# Patient Record
Sex: Female | Born: 1995 | Race: White | Hispanic: No | Marital: Single | Smoking: Current every day smoker
Health system: Southern US, Community
[De-identification: ages and names within clinical notes are randomized; demographics above are authoritative.]

## PROBLEM LIST (undated history)

## (undated) ENCOUNTER — Inpatient Hospital Stay (HOSPITAL_COMMUNITY): Payer: Self-pay

## (undated) DIAGNOSIS — Z789 Other specified health status: Secondary | ICD-10-CM

## (undated) DIAGNOSIS — Z8759 Personal history of other complications of pregnancy, childbirth and the puerperium: Secondary | ICD-10-CM

## (undated) HISTORY — PX: TONSILLECTOMY: SUR1361

## (undated) HISTORY — PX: TUBAL LIGATION: SHX77

## (undated) HISTORY — PX: OTHER SURGICAL HISTORY: SHX169

---
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
## 2016-08-25 ENCOUNTER — Emergency Department (HOSPITAL_COMMUNITY)
Admission: EM | Admit: 2016-08-25 | Discharge: 2016-08-25 | Disposition: A | Discharge status: Home / Self Care | Payer: Medicaid Other | Attending: Emergency Medicine | Admitting: Emergency Medicine

## 2016-08-25 ENCOUNTER — Encounter (HOSPITAL_COMMUNITY): Payer: Self-pay | Admitting: Emergency Medicine

## 2016-08-25 DIAGNOSIS — L03113 Cellulitis of right upper limb: Secondary | ICD-10-CM

## 2016-08-25 DIAGNOSIS — Y999 Unspecified external cause status: Secondary | ICD-10-CM | POA: Insufficient documentation

## 2016-08-25 DIAGNOSIS — Y939 Activity, unspecified: Secondary | ICD-10-CM | POA: Insufficient documentation

## 2016-08-25 DIAGNOSIS — Y929 Unspecified place or not applicable: Secondary | ICD-10-CM | POA: Insufficient documentation

## 2016-08-25 DIAGNOSIS — W57XXXA Bitten or stung by nonvenomous insect and other nonvenomous arthropods, initial encounter: Secondary | ICD-10-CM | POA: Insufficient documentation

## 2016-08-25 LAB — POC URINE PREG, ED: PREG TEST UR: NEGATIVE

## 2016-08-25 MED ORDER — IBUPROFEN 800 MG PO TABS: 800.0000 mg | ORAL_TABLET | Freq: Three times a day (TID) | ORAL | 0 refills | Status: DC

## 2016-08-25 MED ORDER — DOXYCYCLINE HYCLATE 100 MG PO CAPS: 100.0000 mg | ORAL_CAPSULE | Freq: Two times a day (BID) | ORAL | 0 refills | Status: DC

## 2016-08-25 NOTE — Discharge Instructions (Signed)
Return here for any worsened or spreading redness or your symptoms are not starting to improve over the next 48 hours. Apply a warm water soak to your arm for 20 minutes several times daily.

## 2016-08-25 NOTE — ED Triage Notes (Signed)
Pt thinks she was bitten by a brown recluse spider. Pt had large lemon size redness and warmth noted to posterior upper arm.

## 2016-08-25 NOTE — ED Notes (Signed)
Pt sitting in waiting area; NAD noted.
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
## 2017-11-20 ENCOUNTER — Encounter (HOSPITAL_COMMUNITY): Payer: Self-pay | Admitting: Emergency Medicine

## 2017-11-20 ENCOUNTER — Emergency Department (HOSPITAL_COMMUNITY): Payer: No Typology Code available for payment source

## 2017-11-20 ENCOUNTER — Other Ambulatory Visit: Payer: Self-pay

## 2017-11-20 ENCOUNTER — Emergency Department (HOSPITAL_COMMUNITY)
Admission: EM | Admit: 2017-11-20 | Discharge: 2017-11-20 | Disposition: A | Discharge status: Home / Self Care | Payer: No Typology Code available for payment source | Attending: Emergency Medicine | Admitting: Emergency Medicine

## 2017-11-20 DIAGNOSIS — Z79899 Other long term (current) drug therapy: Secondary | ICD-10-CM | POA: Diagnosis not present

## 2017-11-20 DIAGNOSIS — Y939 Activity, unspecified: Secondary | ICD-10-CM | POA: Diagnosis not present

## 2017-11-20 DIAGNOSIS — F17210 Nicotine dependence, cigarettes, uncomplicated: Secondary | ICD-10-CM | POA: Diagnosis not present

## 2017-11-20 DIAGNOSIS — S161XXA Strain of muscle, fascia and tendon at neck level, initial encounter: Secondary | ICD-10-CM

## 2017-11-20 DIAGNOSIS — T148XXA Other injury of unspecified body region, initial encounter: Secondary | ICD-10-CM

## 2017-11-20 DIAGNOSIS — Y999 Unspecified external cause status: Secondary | ICD-10-CM | POA: Insufficient documentation

## 2017-11-20 DIAGNOSIS — Y92410 Unspecified street and highway as the place of occurrence of the external cause: Secondary | ICD-10-CM | POA: Insufficient documentation

## 2017-11-20 DIAGNOSIS — S199XXA Unspecified injury of neck, initial encounter: Secondary | ICD-10-CM | POA: Diagnosis present

## 2017-11-20 MED ORDER — ONDANSETRON HCL 4 MG PO TABS
4.0000 mg | ORAL_TABLET | Freq: Once | ORAL | Status: AC
  Administered 2017-11-20: 4 mg via ORAL
  Filled 2017-11-20: qty 1

## 2017-11-20 MED ORDER — IBUPROFEN 600 MG PO TABS: 600.0000 mg | ORAL_TABLET | Freq: Four times a day (QID) | ORAL | 0 refills | Status: DC

## 2017-11-20 MED ORDER — KETOROLAC TROMETHAMINE 10 MG PO TABS
10.0000 mg | ORAL_TABLET | Freq: Once | ORAL | Status: AC
  Administered 2017-11-20: 10 mg via ORAL
  Filled 2017-11-20: qty 1

## 2017-11-20 MED ORDER — CYCLOBENZAPRINE HCL 10 MG PO TABS: 10.0000 mg | ORAL_TABLET | Freq: Three times a day (TID) | ORAL | 0 refills | Status: DC
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
## 2017-11-20 NOTE — ED Notes (Signed)
Patient to CT.

## 2017-11-20 NOTE — Discharge Instructions (Addendum)
The CT scan of your neck confirms that there is no fracture or dislocation.  You do have a great deal of spasm in the muscles involving her neck and also the ones involving your mid back.  You can expect to be sore over the next few days.  Please use Flexeril 3 times daily for spasm pain.  This medication may cause drowsiness.  Please do not drive a vehicle, operate machinery, handle illegal documents, drink alcohol, or participate in activities requiring concentration when taking this medication.  Please use ibuprofen with breakfast, lunch, dinner, and at bedtime.

## 2018-08-25 ENCOUNTER — Other Ambulatory Visit: Payer: Self-pay

## 2018-08-25 ENCOUNTER — Emergency Department (HOSPITAL_COMMUNITY)
Admission: EM | Admit: 2018-08-25 | Discharge: 2018-08-25 | Disposition: A | Discharge status: Home / Self Care | Payer: Self-pay | Attending: Emergency Medicine | Admitting: Emergency Medicine

## 2018-08-25 ENCOUNTER — Encounter (HOSPITAL_COMMUNITY): Payer: Self-pay

## 2018-08-25 ENCOUNTER — Emergency Department (HOSPITAL_COMMUNITY): Payer: Self-pay

## 2018-08-25 DIAGNOSIS — R0789 Other chest pain: Secondary | ICD-10-CM | POA: Insufficient documentation

## 2018-08-25 DIAGNOSIS — R079 Chest pain, unspecified: Secondary | ICD-10-CM

## 2018-08-25 DIAGNOSIS — Z79899 Other long term (current) drug therapy: Secondary | ICD-10-CM | POA: Insufficient documentation

## 2018-08-25 DIAGNOSIS — F17210 Nicotine dependence, cigarettes, uncomplicated: Secondary | ICD-10-CM | POA: Insufficient documentation

## 2018-08-25 LAB — BASIC METABOLIC PANEL
Anion gap: 7 (ref 5–15)
BUN: 9 mg/dL (ref 6–20)
CO2: 26 mmol/L (ref 22–32)
Calcium: 9.4 mg/dL (ref 8.9–10.3)
Chloride: 104 mmol/L (ref 98–111)
Creatinine, Ser: 0.94 mg/dL (ref 0.44–1.00)
GFR calc Af Amer: >60 mL/min (ref >60)
GFR calc non Af Amer: >60 mL/min (ref >60)
Glucose, Bld: 85 mg/dL (ref 70–99)
Potassium: 4 mmol/L (ref 3.5–5.1)
Sodium: 137 mmol/L (ref 135–145)

## 2018-08-25 LAB — CBC
HCT: 46.1 % — ABNORMAL HIGH (ref 36.0–46.0)
Hemoglobin: 16.1 g/dL — ABNORMAL HIGH (ref 12.0–15.0)
MCH: 32.5 pg (ref 26.0–34.0)
MCHC: 34.9 g/dL (ref 30.0–36.0)
MCV: 93.1 fL (ref 80.0–100.0)
Platelets: 253 10*3/uL (ref 150–400)
RBC: 4.95 MIL/uL (ref 3.87–5.11)
RDW: 12.4 % (ref 11.5–15.5)
WBC: 6.3 10*3/uL (ref 4.0–10.5)
nRBC: 0 % (ref 0.0–0.2)

## 2018-08-25 LAB — TROPONIN I: Troponin I: <0.03 ng/mL (ref <0.03)

## 2018-08-25 MED ORDER — SODIUM CHLORIDE 0.9% FLUSH: 3.0000 mL | Freq: Once | INTRAVENOUS | Status: DC

## 2018-08-25 MED ORDER — NAPROXEN 500 MG PO TABS: 500.0000 mg | ORAL_TABLET | Freq: Two times a day (BID) | ORAL | 0 refills | Status: DC
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
## 2018-08-25 NOTE — ED Notes (Signed)
Pt was informed that we need a urine sample. 

## 2018-08-25 NOTE — ED Triage Notes (Signed)
Pt reports central CP for 3 days . States pain is constant and is a burning/pressure feeling

## 2018-08-25 NOTE — Discharge Instructions (Signed)
Work-up for the chest pain without any acute or dangerous findings.  No evidence of any problems with the heart chest x-ray was normal.  Suspicious that it may be inflammation of your cartilage there in the central part of your chest.  Would recommend a trial of Naprosyn for the next 7 days.  Return for any new or worse symptoms.

## 2018-10-11 ENCOUNTER — Other Ambulatory Visit: Payer: Self-pay

## 2018-10-11 ENCOUNTER — Emergency Department (HOSPITAL_COMMUNITY)
Admission: EM | Admit: 2018-10-11 | Discharge: 2018-10-11 | Disposition: A | Discharge status: Home / Self Care | Payer: Self-pay | Attending: Emergency Medicine | Admitting: Emergency Medicine

## 2018-10-11 ENCOUNTER — Encounter (HOSPITAL_COMMUNITY): Payer: Self-pay | Admitting: Emergency Medicine

## 2018-10-11 DIAGNOSIS — R200 Anesthesia of skin: Secondary | ICD-10-CM | POA: Insufficient documentation

## 2018-10-11 DIAGNOSIS — F17210 Nicotine dependence, cigarettes, uncomplicated: Secondary | ICD-10-CM | POA: Insufficient documentation

## 2018-10-11 DIAGNOSIS — Z79899 Other long term (current) drug therapy: Secondary | ICD-10-CM | POA: Insufficient documentation

## 2018-10-11 DIAGNOSIS — R202 Paresthesia of skin: Secondary | ICD-10-CM

## 2018-10-11 NOTE — Discharge Instructions (Addendum)
Follow up with your md if any problems °
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
## 2018-10-11 NOTE — ED Triage Notes (Signed)
Pt c/o right sided face/chest numbness that started at 2030.
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
## 2019-11-03 ENCOUNTER — Other Ambulatory Visit: Payer: Self-pay

## 2019-11-03 ENCOUNTER — Emergency Department (HOSPITAL_COMMUNITY)
Admission: EM | Admit: 2019-11-03 | Discharge: 2019-11-03 | Disposition: A | Discharge status: Home / Self Care | Payer: Self-pay | Attending: Emergency Medicine | Admitting: Emergency Medicine

## 2019-11-03 ENCOUNTER — Encounter (HOSPITAL_COMMUNITY): Payer: Self-pay | Admitting: *Deleted

## 2019-11-03 DIAGNOSIS — F17210 Nicotine dependence, cigarettes, uncomplicated: Secondary | ICD-10-CM | POA: Insufficient documentation

## 2019-11-03 DIAGNOSIS — L559 Sunburn, unspecified: Secondary | ICD-10-CM | POA: Insufficient documentation

## 2019-11-03 MED ORDER — CALAMINE EX LOTN
1.0000 | TOPICAL_LOTION | CUTANEOUS | 1 refills | Status: DC | PRN
Start: 2019-11-03 — End: 2019-12-05
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
## 2019-11-03 NOTE — ED Triage Notes (Signed)
Pt c/o burning to her skin from sunburn that happened yesterday. Pt reports she was out in the sun yesterday afternoon for about 3 hours and was wearing sunscreen. Pt has used aloe with no relief.

## 2019-12-05 ENCOUNTER — Encounter: Payer: Self-pay | Admitting: Adult Health

## 2019-12-05 ENCOUNTER — Ambulatory Visit (INDEPENDENT_AMBULATORY_CARE_PROVIDER_SITE_OTHER): Payer: Medicaid Other | Admitting: Adult Health

## 2019-12-05 ENCOUNTER — Encounter: Payer: Self-pay | Admitting: *Deleted

## 2019-12-05 VITALS — BP 106/65 | HR 72 | Ht 62.0 in | Wt 201.0 lb

## 2019-12-05 DIAGNOSIS — O3680X0 Pregnancy with inconclusive fetal viability, not applicable or unspecified: Secondary | ICD-10-CM | POA: Insufficient documentation

## 2019-12-05 DIAGNOSIS — Z3201 Encounter for pregnancy test, result positive: Secondary | ICD-10-CM

## 2019-12-05 DIAGNOSIS — Z98891 History of uterine scar from previous surgery: Secondary | ICD-10-CM

## 2019-12-05 DIAGNOSIS — Z3A08 8 weeks gestation of pregnancy: Secondary | ICD-10-CM

## 2019-12-05 LAB — POCT URINE PREGNANCY: Preg Test, Ur: POSITIVE — AB

## 2019-12-05 NOTE — Patient Instructions (Signed)
First Trimester of Pregnancy The first trimester of pregnancy is from week 1 until the end of week 13 (months 1 through 3). A week after a sperm fertilizes an egg, the egg will implant on the wall of the uterus. This embryo will begin to develop into a baby. Genes from you and your partner will form the baby. The female genes will determine whether the baby will be a boy or a girl. At 6-8 weeks, the eyes and face will be formed, and the heartbeat can be seen on ultrasound. At the end of 12 weeks, all the baby's organs will be formed. Now that you are pregnant, you will want to do everything you can to have a healthy baby. Two of the most important things are to get good prenatal care and to follow your health care provider's instructions. Prenatal care is all the medical care you receive before the baby's birth. This care will help prevent, find, and treat any problems during the pregnancy and childbirth. Body changes during your first trimester Your body goes through many changes during pregnancy. The changes vary from woman to woman.  You may gain or lose a couple of pounds at first.  You may feel sick to your stomach (nauseous) and you may throw up (vomit). If the vomiting is uncontrollable, call your health care provider.  You may tire easily.  You may develop headaches that can be relieved by medicines. All medicines should be approved by your health care provider.  You may urinate more often. Painful urination may mean you have a bladder infection.  You may develop heartburn as a result of your pregnancy.  You may develop constipation because certain hormones are causing the muscles that push stool through your intestines to slow down.  You may develop hemorrhoids or swollen veins (varicose veins).  Your breasts may begin to grow larger and become tender. Your nipples may stick out more, and the tissue that surrounds them (areola) may become darker.  Your gums may bleed and may be  sensitive to brushing and flossing.  Dark spots or blotches (chloasma, mask of pregnancy) may develop on your face. This will likely fade after the baby is born.  Your menstrual periods will stop.  You may have a loss of appetite.  You may develop cravings for certain kinds of food.  You may have changes in your emotions from day to day, such as being excited to be pregnant or being concerned that something may go wrong with the pregnancy and baby.  You may have more vivid and strange dreams.  You may have changes in your hair. These can include thickening of your hair, rapid growth, and changes in texture. Some women also have hair loss during or after pregnancy, or hair that feels dry or thin. Your hair will most likely return to normal after your baby is born. What to expect at prenatal visits During a routine prenatal visit:  You will be weighed to make sure you and the baby are growing normally.  Your blood pressure will be taken.  Your abdomen will be measured to track your baby's growth.  The fetal heartbeat will be listened to between weeks 10 and 14 of your pregnancy.  Test results from any previous visits will be discussed. Your health care provider may ask you:  How you are feeling.  If you are feeling the baby move.  If you have had any abnormal symptoms, such as leaking fluid, bleeding, severe headaches, or abdominal   cramping.  If you are using any tobacco products, including cigarettes, chewing tobacco, and electronic cigarettes.  If you have any questions. Other tests that may be performed during your first trimester include:  Blood tests to find your blood type and to check for the presence of any previous infections. The tests will also be used to check for low iron levels (anemia) and protein on red blood cells (Rh antibodies). Depending on your risk factors, or if you previously had diabetes during pregnancy, you may have tests to check for high blood sugar  that affects pregnant women (gestational diabetes).  Urine tests to check for infections, diabetes, or protein in the urine.  An ultrasound to confirm the proper growth and development of the baby.  Fetal screens for spinal cord problems (spina bifida) and Down syndrome.  HIV (human immunodeficiency virus) testing. Routine prenatal testing includes screening for HIV, unless you choose not to have this test.  You may need other tests to make sure you and the baby are doing well. Follow these instructions at home: Medicines  Follow your health care provider's instructions regarding medicine use. Specific medicines may be either safe or unsafe to take during pregnancy.  Take a prenatal vitamin that contains at least 600 micrograms (mcg) of folic acid.  If you develop constipation, try taking a stool softener if your health care provider approves. Eating and drinking   Eat a balanced diet that includes fresh fruits and vegetables, whole grains, good sources of protein such as meat, eggs, or tofu, and low-fat dairy. Your health care provider will help you determine the amount of weight gain that is right for you.  Avoid raw meat and uncooked cheese. These carry germs that can cause birth defects in the baby.  Eating four or five small meals rather than three large meals a day may help relieve nausea and vomiting. If you start to feel nauseous, eating a few soda crackers can be helpful. Drinking liquids between meals, instead of during meals, also seems to help ease nausea and vomiting.  Limit foods that are high in fat and processed sugars, such as fried and sweet foods.  To prevent constipation: ? Eat foods that are high in fiber, such as fresh fruits and vegetables, whole grains, and beans. ? Drink enough fluid to keep your urine clear or pale yellow. Activity  Exercise only as directed by your health care provider. Most women can continue their usual exercise routine during  pregnancy. Try to exercise for 30 minutes at least 5 days a week. Exercising will help you: ? Control your weight. ? Stay in shape. ? Be prepared for labor and delivery.  Experiencing pain or cramping in the lower abdomen or lower back is a good sign that you should stop exercising. Check with your health care provider before continuing with normal exercises.  Try to avoid standing for long periods of time. Move your legs often if you must stand in one place for a long time.  Avoid heavy lifting.  Wear low-heeled shoes and practice good posture.  You may continue to have sex unless your health care provider tells you not to. Relieving pain and discomfort  Wear a good support bra to relieve breast tenderness.  Take warm sitz baths to soothe any pain or discomfort caused by hemorrhoids. Use hemorrhoid cream if your health care provider approves.  Rest with your legs elevated if you have leg cramps or low back pain.  If you develop varicose veins in   your legs, wear support hose. Elevate your feet for 15 minutes, 3-4 times a day. Limit salt in your diet. Prenatal care  Schedule your prenatal visits by the twelfth week of pregnancy. They are usually scheduled monthly at first, then more often in the last 2 months before delivery.  Write down your questions. Take them to your prenatal visits.  Keep all your prenatal visits as told by your health care provider. This is important. Safety  Wear your seat belt at all times when driving.  Make a list of emergency phone numbers, including numbers for family, friends, the hospital, and police and fire departments. General instructions  Ask your health care provider for a referral to a local prenatal education class. Begin classes no later than the beginning of month 6 of your pregnancy.  Ask for help if you have counseling or nutritional needs during pregnancy. Your health care provider can offer advice or refer you to specialists for help  with various needs.  Do not use hot tubs, steam rooms, or saunas.  Do not douche or use tampons or scented sanitary pads.  Do not cross your legs for long periods of time.  Avoid cat litter boxes and soil used by cats. These carry germs that can cause birth defects in the baby and possibly loss of the fetus by miscarriage or stillbirth.  Avoid all smoking, herbs, alcohol, and medicines not prescribed by your health care provider. Chemicals in these products affect the formation and growth of the baby.  Do not use any products that contain nicotine or tobacco, such as cigarettes and e-cigarettes. If you need help quitting, ask your health care provider. You may receive counseling support and other resources to help you quit.  Schedule a dentist appointment. At home, brush your teeth with a soft toothbrush and be gentle when you floss. Contact a health care provider if:  You have dizziness.  You have mild pelvic cramps, pelvic pressure, or nagging pain in the abdominal area.  You have persistent nausea, vomiting, or diarrhea.  You have a bad smelling vaginal discharge.  You have pain when you urinate.  You notice increased swelling in your face, hands, legs, or ankles.  You are exposed to fifth disease or chickenpox.  You are exposed to German measles (rubella) and have never had it. Get help right away if:  You have a fever.  You are leaking fluid from your vagina.  You have spotting or bleeding from your vagina.  You have severe abdominal cramping or pain.  You have rapid weight gain or loss.  You vomit blood or material that looks like coffee grounds.  You develop a severe headache.  You have shortness of breath.  You have any kind of trauma, such as from a fall or a car accident. Summary  The first trimester of pregnancy is from week 1 until the end of week 13 (months 1 through 3).  Your body goes through many changes during pregnancy. The changes vary from  woman to woman.  You will have routine prenatal visits. During those visits, your health care provider will examine you, discuss any test results you may have, and talk with you about how you are feeling. This information is not intended to replace advice given to you by your health care provider. Make sure you discuss any questions you have with your health care provider. Document Revised: 04/15/2017 Document Reviewed: 04/14/2016 Elsevier Patient Education  2020 [employer]  

## 2019-12-05 NOTE — Progress Notes (Signed)
°  Subjective:     Patient ID: Joyce Ball, female   DOB: 10-01-1995, 24 y.o.   MRN: [id number]  HPI Lucelia is a 24 year old white female,single, in for UPT has missed periods and had 5+HPTs. She works at [employer]. She had C section in May 2017, she had borderline gestational Diabetes, not on any meds.  Review of Systems +missed periods,with 5+HPTs Reviewed past medical,surgical, social and family history. Reviewed medications and allergies.     Objective:   Physical Exam BP 106/65 (BP Location: Left Arm, Patient Position: Sitting, Cuff Size: Normal)    Pulse 72    Ht 5\' 2"  (1.575 m)    Wt 201 lb (91.2 kg)    LMP 10/05/2019    BMI 36.76 kg/m UPT is +, about 8+5 weeks by LMP with EDD 07/11/20. Skin warm and dry. Neck: mid line trachea, normal thyroid, good ROM, no lymphadenopathy noted. Lungs: clear to ausculation bilaterally. Cardiovascular: regular rate and rhythm.Abdomen is soft and non tender AA is 1 Fall risk is low PHQ 9 score is 1     Assessment:     1. Pregnancy test positive Continue Prenatal Gummies  2. [redacted] weeks gestation of pregnancy   3. Encounter to determine fetal viability of pregnancy, single or unspecified fetus Return in about 1 week for dating 07/13/20   4. History of cesarean section Notes in [facility]    Plan:     Review handout by [facility]

## 2019-12-12 ENCOUNTER — Ambulatory Visit (INDEPENDENT_AMBULATORY_CARE_PROVIDER_SITE_OTHER): Payer: Medicaid Other

## 2019-12-12 DIAGNOSIS — O3680X0 Pregnancy with inconclusive fetal viability, not applicable or unspecified: Secondary | ICD-10-CM | POA: Diagnosis not present

## 2019-12-12 NOTE — Progress Notes (Signed)
[place] 9+5 wks,single IUP with ys,fhr 169 bpm,crl 25.75 mm,normal ovaries

## 2019-12-14 ENCOUNTER — Encounter: Payer: Self-pay | Admitting: *Deleted

## 2019-12-14 ENCOUNTER — Telehealth: Payer: Self-pay | Admitting: Advanced Practice Midwife

## 2019-12-14 NOTE — Telephone Encounter (Signed)
Letter completed for patient per Irving Copas, Ok to have more frequent breaks. Attempted to call patient, no answer. Letter at front desk.

## 2019-12-14 NOTE — Telephone Encounter (Signed)
Patient wants to know if she can get a doctors note to go to the bathroom when needed. She stated her job is making a big deal about her going to the bathroom so  Much.  She also stated if possible to call her after one.

## 2020-01-02 ENCOUNTER — Encounter: Payer: Self-pay | Admitting: Advanced Practice Midwife

## 2020-01-02 ENCOUNTER — Ambulatory Visit (INDEPENDENT_AMBULATORY_CARE_PROVIDER_SITE_OTHER): Payer: Medicaid Other | Admitting: Advanced Practice Midwife

## 2020-01-02 ENCOUNTER — Ambulatory Visit: Payer: Medicaid Other | Admitting: *Deleted

## 2020-01-02 ENCOUNTER — Other Ambulatory Visit: Payer: Medicaid Other

## 2020-01-02 ENCOUNTER — Other Ambulatory Visit: Payer: Self-pay

## 2020-01-02 VITALS — BP 101/65 | HR 74 | Wt 198.5 lb

## 2020-01-02 DIAGNOSIS — Z3480 Encounter for supervision of other normal pregnancy, unspecified trimester: Secondary | ICD-10-CM

## 2020-01-02 DIAGNOSIS — Z98891 History of uterine scar from previous surgery: Secondary | ICD-10-CM

## 2020-01-02 DIAGNOSIS — Z331 Pregnant state, incidental: Secondary | ICD-10-CM

## 2020-01-02 DIAGNOSIS — Z3490 Encounter for supervision of normal pregnancy, unspecified, unspecified trimester: Secondary | ICD-10-CM | POA: Insufficient documentation

## 2020-01-02 DIAGNOSIS — O0990 Supervision of high risk pregnancy, unspecified, unspecified trimester: Secondary | ICD-10-CM | POA: Insufficient documentation

## 2020-01-02 DIAGNOSIS — Z3A12 12 weeks gestation of pregnancy: Secondary | ICD-10-CM | POA: Diagnosis not present

## 2020-01-02 DIAGNOSIS — Z1389 Encounter for screening for other disorder: Secondary | ICD-10-CM

## 2020-01-02 LAB — POCT URINALYSIS DIPSTICK OB
Blood, UA: NEGATIVE
Glucose, UA: NEGATIVE
Ketones, UA: NEGATIVE
Leukocytes, UA: NEGATIVE
Nitrite, UA: POSITIVE
POC,PROTEIN,UA: NEGATIVE

## 2020-01-02 NOTE — Progress Notes (Signed)
   [facility]  SUBJECTIVE:  Joyce Ball is a 24 y.o. G79P1001 female here for Panorama NIPT and Horizon Carrier Screening . She is [redacted]w[redacted]d pregnant.   OBJECTIVE:  Appears well, in no apparent distress  Blood work unable to be drawn from left [facility]. 1 attempt(s). Pt very dry and dehydrated.  PLAN: Pt to hydrate and call to reschedule for labs to be drawn.  Joyce Ball  01/02/2020 10:16 AM

## 2020-01-02 NOTE — Progress Notes (Signed)
INITIAL OBSTETRICAL VISIT Patient name: Joyce Ball MRN [id number]  Date of birth: [birth year] Chief Complaint:   Initial Prenatal Visit  History of Present Illness:   Joyce Ball is a 24 y.o. G39P1001 Caucasian female at [redacted]w[redacted]d by LMP c/w u/s at 9 weeks with an Estimated Date of Delivery: 07/11/20 being seen today for her initial obstetrical visit.   Her obstetrical history is significant for hx pLTCS for NRFHR and failed vacuum.   Today she reports no complaints.  Depression screen [facility] 2/9 01/02/2020 12/05/2019  Decreased Interest 0 0  Down, Depressed, Hopeless 0 0  PHQ - 2 Score 0 0  Altered sleeping 0 0  Tired, decreased energy 0 1  Change in appetite 0 0  Feeling bad or failure about yourself  0 0  Trouble concentrating 0 0  Moving slowly or fidgety/restless 0 0  Suicidal thoughts 0 0  PHQ-9 Score 0 1    Patient's last menstrual period was 10/05/2019. Last pap 2019 at [facility]. Results were: normal Review of Systems:   Pertinent items are noted in HPI Denies cramping/contractions, leakage of fluid, vaginal bleeding, abnormal vaginal discharge w/ itching/odor/irritation, headaches, visual changes, shortness of breath, chest pain, abdominal pain, severe nausea/vomiting, or problems with urination or bowel movements unless otherwise stated above.  Pertinent History Reviewed:  Reviewed past medical,surgical, social, obstetrical and family history.  Reviewed problem list, medications and allergies. OB History  Gravida Para Term Preterm AB Living  2 1 1     1   SAB TAB Ectopic Multiple Live Births        0 1    # Outcome Date GA Lbr Len/2nd Weight Sex Delivery Anes PTL Lv  2 Current           1 Term 10/04/15 [redacted]w[redacted]d 07:01 / 00:41 7 lb 8.6 oz (3.42 kg) F CS-LTranv EPI N LIV     Complications: Fetal Intolerance, Failed vacuum extraction delivery   Physical Assessment:   Vitals:   01/02/20 0953  BP: 101/65  Pulse: 74  Weight: 198 lb 8 oz (90 kg)  Body mass index is 36.31  kg/m.       Physical Examination:  General appearance - well appearing, and in no distress  Mental status - alert, oriented to person, place, and time  Psych:  She has a normal mood and affect  Skin - warm and dry, normal color, no suspicious lesions noted  Chest - effort normal, all lung fields clear to auscultation bilaterally  Heart - normal rate and regular rhythm  Abdomen - soft, nontender  Extremities:  No swelling or varicosities noted  Pelvic - not indicated  Thin prep pap is not done   TODAY'S FHT 153 via doppler (u/s staff not available for NT today)  Results for orders placed or performed in visit on 01/02/20 (from the past 24 hour(s))  POC Urinalysis Dipstick OB   Collection Time: 01/02/20  9:39 AM  Result Value Ref Range   Color, UA     Clarity, UA     Glucose, UA Negative Negative   Bilirubin, UA     Ketones, UA neg    Spec Grav, UA     Blood, UA neg    pH, UA     POC,PROTEIN,UA Negative Negative, Trace, Small (1+), Moderate (2+), Large (3+), 4+   Urobilinogen, UA     Nitrite, UA positive    Leukocytes, UA Negative Negative   Appearance     Odor  Assessment & Plan:  1) Low-Risk Pregnancy G2P1001 at [redacted]w[redacted]d with an Estimated Date of Delivery: 07/11/20   2) Initial OB visit   3) Previous C/S for NRFHR/failed vac> VBAC consent reviewed with R&Bs for both; pt leaning towards repeat C/S; will continue to review  4) Hx PPH> didn't require blood tx  5) + Nitrites, no symptoms> to culture  Meds: No orders of the defined types were placed in this encounter.   Initial labs obtained Continue prenatal vitamins Reviewed n/v relief measures and warning s/s to report Reviewed recommended weight gain based on pre-gravid BMI Encouraged well-balanced diet Genetic & carrier screening discussed: requests Panorama and NT/IT, requests Horizon 14 , but unable to be drawn today as she is dehydrated. Will redo with NT u/s later this week. Ultrasound discussed; fetal  survey: requested CCNC completed> form faxed if has or is planning to apply for medicaid The nature of [facility] [employer] with multiple MDs and other Advanced Practice Providers was explained to patient; also emphasized that fellows, residents, and students are part of our team. Has home bp cuff. Check bp weekly, let us know if >140/90.   No indications for ASA therapy or early Hgb A1c (per uptodate)   Follow-up: Return in about 5 weeks (around 02/08/2020) for (return ASAP for NT u/s); , LR[facility], in person, [place]: Anatomy, 2nd IT.   Orders Placed This Encounter  Procedures  . GC/Chlamydia Probe Amp  . Urine Culture  . US OB Comp + 14 Wk  . Pain Management Screening Profile (10S)  . CBC/D/Plt+RPR+Rh+ABO+Rub Ab...  . Genetic Screening  . POC Urinalysis Dipstick OB    Arabella Merles [facility] 01/02/2020 2:17 PM

## 2020-01-02 NOTE — Patient Instructions (Signed)
Joyce Ball, I greatly value your feedback.  If you receive a survey following your visit with [place] today, we appreciate you taking the time to fill it out.  Thanks, Philipp Deputy CNM   [facility] ([address] [place], [place] [address]) Entrance C, located off of [address] Free 24/7 valet parking   Nausea & Vomiting  Have saltine crackers or pretzels by your bed and eat a few bites before you raise your head out of bed in the morning  Eat small frequent meals throughout the day instead of large meals  Drink plenty of fluids throughout the day to stay hydrated, just don't drink a lot of fluids with your meals.  This can make your stomach fill up faster making you feel sick  Do not brush your teeth right after you eat  Products with real ginger are good for nausea, like ginger ale and ginger hard candy Make sure it says made with real ginger!  Sucking on sour candy like lemon heads is also good for nausea  If your prenatal vitamins make you nauseated, take them at night so you will sleep through the nausea  Sea Bands  If you feel like you need medicine for the nausea & vomiting please let us know  If you are unable to keep any fluids or food down please let us know   Constipation  Drink plenty of fluid, preferably water, throughout the day  Eat foods high in fiber such as fruits, vegetables, and grains  Exercise, such as walking, is a good way to keep your bowels regular  Drink warm fluids, especially warm prune juice, or decaf coffee  Eat a 1/2 cup of real oatmeal (not instant), 1/2 cup applesauce, and 1/2-1 cup warm prune juice every day  If needed, you may take Colace (docusate sodium) stool softener once or twice a day to help keep the stool soft.   If you still are having problems with constipation, you may take Miralax once daily as needed to help keep your bowels regular.   Home Blood Pressure Monitoring for Patients   Your  provider has recommended that you check your blood pressure (BP) at least once a week at home. If you do not have a blood pressure cuff at home, one will be provided for you. Contact your provider if you have not received your monitor within 1 week.   Helpful Tips for Accurate Home Blood Pressure Checks  . Don't smoke, exercise, or drink caffeine 30 minutes before checking your BP . Use the restroom before checking your BP (a full bladder can raise your pressure) . Relax in a comfortable upright chair . Feet on the ground . Left arm resting comfortably on a flat surface at the level of your heart . Legs uncrossed . Back supported . Sit quietly and don't talk . Place the cuff on your bare arm . Adjust snuggly, so that only two fingertips can fit between your skin and the top of the cuff . Check 2 readings separated by at least one minute . Keep a log of your BP readings . For a visual, please reference this diagram: http://[facility].care/bpdiagram  Provider Name: [facility]     Phone: [phone]  Zone 1: ALL CLEAR  Continue to monitor your symptoms:  . BP reading is less than 140 (top number) or less than 90 (bottom number)  . No right upper stomach pain . No headaches or seeing spots .  No feeling nauseated or throwing up . No swelling in face and hands  Zone 2: CAUTION Call your doctor's office for any of the following:  . BP reading is greater than 140 (top number) or greater than 90 (bottom number)  . Stomach pain under your ribs in the middle or right side . Headaches or seeing spots . Feeling nauseated or throwing up . Swelling in face and hands  Zone 3: EMERGENCY  Seek immediate medical care if you have any of the following:  . BP reading is greater than160 (top number) or greater than 110 (bottom number) . Severe headaches not improving with Tylenol . Serious difficulty catching your breath . Any worsening symptoms from Zone 2    First Trimester of Pregnancy The  first trimester of pregnancy is from week 1 until the end of week 12 (months 1 through 3). A week after a sperm fertilizes an egg, the egg will implant on the wall of the uterus. This embryo will begin to develop into a baby. Genes from you and your partner are forming the baby. The female genes determine whether the baby is a boy or a girl. At 6-8 weeks, the eyes and face are formed, and the heartbeat can be seen on ultrasound. At the end of 12 weeks, all the baby's organs are formed.  Now that you are pregnant, you will want to do everything you can to have a healthy baby. Two of the most important things are to get good prenatal care and to follow your health care provider's instructions. Prenatal care is all the medical care you receive before the baby's birth. This care will help prevent, find, and treat any problems during the pregnancy and childbirth. BODY CHANGES Your body goes through many changes during pregnancy. The changes vary from woman to woman.   You may gain or lose a couple of pounds at first.  You may feel sick to your stomach (nauseous) and throw up (vomit). If the vomiting is uncontrollable, call your health care provider.  You may tire easily.  You may develop headaches that can be relieved by medicines approved by your health care provider.  You may urinate more often. Painful urination may mean you have a bladder infection.  You may develop heartburn as a result of your pregnancy.  You may develop constipation because certain hormones are causing the muscles that push waste through your intestines to slow down.  You may develop hemorrhoids or swollen, bulging veins (varicose veins).  Your breasts may begin to grow larger and become tender. Your nipples may stick out more, and the tissue that surrounds them (areola) may become darker.  Your gums may bleed and may be sensitive to brushing and flossing.  Dark spots or blotches (chloasma, mask of pregnancy) may develop on  your face. This will likely fade after the baby is born.  Your menstrual periods will stop.  You may have a loss of appetite.  You may develop cravings for certain kinds of food.  You may have changes in your emotions from day to day, such as being excited to be pregnant or being concerned that something may go wrong with the pregnancy and baby.  You may have more vivid and strange dreams.  You may have changes in your hair. These can include thickening of your hair, rapid growth, and changes in texture. Some women also have hair loss during or after pregnancy, or hair that feels dry or thin. Your hair will most  likely return to normal after your baby is born. WHAT TO EXPECT AT YOUR PRENATAL VISITS During a routine prenatal visit:  You will be weighed to make sure you and the baby are growing normally.  Your blood pressure will be taken.  Your abdomen will be measured to track your baby's growth.  The fetal heartbeat will be listened to starting around week 10 or 12 of your pregnancy.  Test results from any previous visits will be discussed. Your health care provider may ask you:  How you are feeling.  If you are feeling the baby move.  If you have had any abnormal symptoms, such as leaking fluid, bleeding, severe headaches, or abdominal cramping.  If you have any questions. Other tests that may be performed during your first trimester include:  Blood tests to find your blood type and to check for the presence of any previous infections. They will also be used to check for low iron levels (anemia) and Rh antibodies. Later in the pregnancy, blood tests for diabetes will be done along with other tests if problems develop.  Urine tests to check for infections, diabetes, or protein in the urine.  An ultrasound to confirm the proper growth and development of the baby.  An amniocentesis to check for possible genetic problems.  Fetal screens for spina bifida and Down  syndrome.  You may need other tests to make sure you and the baby are doing well. HOME CARE INSTRUCTIONS  Medicines  Follow your health care provider's instructions regarding medicine use. Specific medicines may be either safe or unsafe to take during pregnancy.  Take your prenatal vitamins as directed.  If you develop constipation, try taking a stool softener if your health care provider approves. Diet  Eat regular, well-balanced meals. Choose a variety of foods, such as meat or vegetable-based protein, fish, milk and low-fat dairy products, vegetables, fruits, and whole grain breads and cereals. Your health care provider will help you determine the amount of weight gain that is right for you.  Avoid raw meat and uncooked cheese. These carry germs that can cause birth defects in the baby.  Eating four or five small meals rather than three large meals a day may help relieve nausea and vomiting. If you start to feel nauseous, eating a few soda crackers can be helpful. Drinking liquids between meals instead of during meals also seems to help nausea and vomiting.  If you develop constipation, eat more high-fiber foods, such as fresh vegetables or fruit and whole grains. Drink enough fluids to keep your urine clear or pale yellow. Activity and Exercise  Exercise only as directed by your health care provider. Exercising will help you:  Control your weight.  Stay in shape.  Be prepared for labor and delivery.  Experiencing pain or cramping in the lower abdomen or low back is a good sign that you should stop exercising. Check with your health care provider before continuing normal exercises.  Try to avoid standing for long periods of time. Move your legs often if you must stand in one place for a long time.  Avoid heavy lifting.  Wear low-heeled shoes, and practice good posture.  You may continue to have sex unless your health care provider directs you otherwise. Relief of Pain or  Discomfort  Wear a good support bra for breast tenderness.    Take warm sitz baths to soothe any pain or discomfort caused by hemorrhoids. Use hemorrhoid cream if your health care provider approves.  Rest with your legs elevated if you have leg cramps or low back pain.  If you develop varicose veins in your legs, wear support hose. Elevate your feet for 15 minutes, 3-4 times a day. Limit salt in your diet. Prenatal Care  Schedule your prenatal visits by the twelfth week of pregnancy. They are usually scheduled monthly at first, then more often in the last 2 months before delivery.  Write down your questions. Take them to your prenatal visits.  Keep all your prenatal visits as directed by your health care provider. Safety  Wear your seat belt at all times when driving.  Make a list of emergency phone numbers, including numbers for family, friends, the hospital, and police and fire departments. General Tips  Ask your health care provider for a referral to a local prenatal education class. Begin classes no later than at the beginning of month 6 of your pregnancy.  Ask for help if you have counseling or nutritional needs during pregnancy. Your health care provider can offer advice or refer you to specialists for help with various needs.  Do not use hot tubs, steam rooms, or saunas.  Do not douche or use tampons or scented sanitary pads.  Do not cross your legs for long periods of time.  Avoid cat litter boxes and soil used by cats. These carry germs that can cause birth defects in the baby and possibly loss of the fetus by miscarriage or stillbirth.  Avoid all smoking, herbs, alcohol, and medicines not prescribed by your health care provider. Chemicals in these affect the formation and growth of the baby.  Schedule a dentist appointment. At home, brush your teeth with a soft toothbrush and be gentle when you floss. SEEK MEDICAL CARE IF:   You have dizziness.  You have mild  pelvic cramps, pelvic pressure, or nagging pain in the abdominal area.  You have persistent nausea, vomiting, or diarrhea.  You have a bad smelling vaginal discharge.  You have pain with urination.  You notice increased swelling in your face, hands, legs, or ankles. SEEK IMMEDIATE MEDICAL CARE IF:   You have a fever.  You are leaking fluid from your vagina.  You have spotting or bleeding from your vagina.  You have severe abdominal cramping or pain.  You have rapid weight gain or loss.  You vomit blood or material that looks like coffee grounds.  You are exposed to [place] measles and have never had them.  You are exposed to fifth disease or chickenpox.  You develop a severe headache.  You have shortness of breath.  You have any kind of trauma, such as from a fall or a car accident. Document Released: 04/27/2001 Document Revised: 09/17/2013 Document Reviewed: 03/13/2013 [facility] Patient Information 2015 [place], [place]. This information is not intended to replace advice given to you by your health care provider. Make sure you discuss any questions you have with your health care provider.

## 2020-01-03 ENCOUNTER — Other Ambulatory Visit: Payer: Self-pay | Admitting: Obstetrics and Gynecology

## 2020-01-03 ENCOUNTER — Other Ambulatory Visit: Payer: Medicaid Other

## 2020-01-03 ENCOUNTER — Ambulatory Visit (INDEPENDENT_AMBULATORY_CARE_PROVIDER_SITE_OTHER): Payer: Medicaid Other

## 2020-01-03 DIAGNOSIS — Z3682 Encounter for antenatal screening for nuchal translucency: Secondary | ICD-10-CM

## 2020-01-03 DIAGNOSIS — Z8759 Personal history of other complications of pregnancy, childbirth and the puerperium: Secondary | ICD-10-CM

## 2020-01-03 DIAGNOSIS — Z3400 Encounter for supervision of normal first pregnancy, unspecified trimester: Secondary | ICD-10-CM

## 2020-01-03 DIAGNOSIS — Z3A12 12 weeks gestation of pregnancy: Secondary | ICD-10-CM | POA: Diagnosis not present

## 2020-01-03 DIAGNOSIS — Z3480 Encounter for supervision of other normal pregnancy, unspecified trimester: Secondary | ICD-10-CM | POA: Diagnosis not present

## 2020-01-03 LAB — PMP SCREEN PROFILE (10S), URINE
Amphetamine Scrn, Ur: NEGATIVE ng/mL
BARBITURATE SCREEN URINE: NEGATIVE ng/mL
BENZODIAZEPINE SCREEN, URINE: NEGATIVE ng/mL
CANNABINOIDS UR QL SCN: NEGATIVE ng/mL
Cocaine (Metab) Scrn, Ur: NEGATIVE ng/mL
Creatinine(Crt), U: 219.3 mg/dL (ref 20.0–300.0)
Methadone Screen, Urine: NEGATIVE ng/mL
OXYCODONE+OXYMORPHONE UR QL SCN: NEGATIVE ng/mL
Opiate Scrn, Ur: NEGATIVE ng/mL
Ph of Urine: 6.7 (ref 4.5–8.9)
Phencyclidine Qn, Ur: NEGATIVE ng/mL
Propoxyphene Scrn, Ur: NEGATIVE ng/mL

## 2020-01-03 NOTE — Progress Notes (Signed)
   NURSE VISIT- [facility]  SUBJECTIVE:  Joyce Ball is a 24 y.o. G26P1001 female here for Panorama NIPT and Horizon Carrier Screening . She is [redacted]w[redacted]d pregnant.   OBJECTIVE:  Appears well, in no apparent distress  Blood work drawn from left [facility] without difficulty. 1 attempt(s).   ASSESSMENT: Pregnancy [redacted]w[redacted]d Panorama NIPT and Horizon Carrier Screening  PLAN: [facility] portal information given and instructed patient how to access results Follow-up as scheduled  Jobe Marker  01/03/2020 1:02 PM

## 2020-01-03 NOTE — Progress Notes (Signed)
[place] 12+6 wks,measurements c/w dates,crl 64.08 mm,fhr 141 bpm,NB present,NT 1.6 mm,normal ovaries,posterior placenta

## 2020-01-04 ENCOUNTER — Other Ambulatory Visit: Payer: Self-pay | Admitting: Advanced Practice Midwife

## 2020-01-04 DIAGNOSIS — R8271 Bacteriuria: Secondary | ICD-10-CM | POA: Insufficient documentation

## 2020-01-04 DIAGNOSIS — O99891 Other specified diseases and conditions complicating pregnancy: Secondary | ICD-10-CM | POA: Insufficient documentation

## 2020-01-04 LAB — CBC/D/PLT+RPR+RH+ABO+RUB AB...
Antibody Screen: NEGATIVE
Basophils Absolute: 0 10*3/uL (ref 0.0–0.2)
Basos: 0 %
EOS (ABSOLUTE): 0 10*3/uL (ref 0.0–0.4)
Eos: 0 %
HCV Ab: <0.1 s/co ratio (ref 0.0–0.9)
HIV Screen 4th Generation wRfx: NONREACTIVE
Hematocrit: 41.6 % (ref 34.0–46.6)
Hemoglobin: 14.5 g/dL (ref 11.1–15.9)
Hepatitis B Surface Ag: NEGATIVE
Immature Grans (Abs): 0 10*3/uL (ref 0.0–0.1)
Immature Granulocytes: 0 %
Lymphocytes Absolute: 1.6 10*3/uL (ref 0.7–3.1)
Lymphs: 20 %
MCH: 32 pg (ref 26.6–33.0)
MCHC: 34.9 g/dL (ref 31.5–35.7)
MCV: 92 fL (ref 79–97)
Monocytes Absolute: 0.5 10*3/uL (ref 0.1–0.9)
Monocytes: 6 %
Neutrophils Absolute: 5.8 10*3/uL (ref 1.4–7.0)
Neutrophils: 74 %
Platelets: 221 10*3/uL (ref 150–450)
RBC: 4.53 x10E6/uL (ref 3.77–5.28)
RDW: 12.9 % (ref 11.7–15.4)
RPR Ser Ql: NONREACTIVE
Rh Factor: POSITIVE
Rubella Antibodies, IGG: 10.7 index (ref >0.99)
WBC: 7.9 10*3/uL (ref 3.4–10.8)

## 2020-01-04 LAB — GC/CHLAMYDIA PROBE AMP
Chlamydia trachomatis, NAA: NEGATIVE
Neisseria Gonorrhoeae by PCR: NEGATIVE

## 2020-01-04 LAB — HCV INTERPRETATION

## 2020-01-04 MED ORDER — NITROFURANTOIN MONOHYD MACRO 100 MG PO CAPS
100.0000 mg | ORAL_CAPSULE | Freq: Two times a day (BID) | ORAL | 0 refills | Status: DC
Start: 2020-01-04 — End: 2020-04-04

## 2020-01-05 LAB — URINE CULTURE

## 2020-01-05 LAB — INTEGRATED 1
Crown Rump Length: 64.1 mm
Gest. Age on Collection Date: 12.6 weeks
Maternal Age at EDD: 23.8 yr
Nuchal Translucency (NT): 1.6 mm
Number of Fetuses: 1
PAPP-A Value: 1534.4 ng/mL
Weight: 198 [lb_av]

## 2020-01-07 ENCOUNTER — Telehealth: Payer: Self-pay | Admitting: *Deleted

## 2020-01-07 NOTE — Telephone Encounter (Signed)
Unable to reach patient ° °Will try again at a later time ° °

## 2020-01-08 ENCOUNTER — Telehealth: Payer: Self-pay | Admitting: *Deleted

## 2020-01-08 NOTE — Telephone Encounter (Signed)
Patient informed antibiotic has been sent in for her UTI that showed on her urine culture.  Advised to take all of the medication prescribed and urine would be checked at her next visit.  Verbalized understanding.

## 2020-02-05 ENCOUNTER — Other Ambulatory Visit: Payer: Self-pay | Admitting: Advanced Practice Midwife

## 2020-02-05 DIAGNOSIS — Z363 Encounter for antenatal screening for malformations: Secondary | ICD-10-CM

## 2020-02-05 DIAGNOSIS — Z3480 Encounter for supervision of other normal pregnancy, unspecified trimester: Secondary | ICD-10-CM

## 2020-02-06 ENCOUNTER — Ambulatory Visit (INDEPENDENT_AMBULATORY_CARE_PROVIDER_SITE_OTHER): Payer: Medicaid Other

## 2020-02-06 ENCOUNTER — Ambulatory Visit (INDEPENDENT_AMBULATORY_CARE_PROVIDER_SITE_OTHER): Payer: Medicaid Other | Admitting: Advanced Practice Midwife

## 2020-02-06 VITALS — BP 117/69 | HR 83 | Wt 201.0 lb

## 2020-02-06 DIAGNOSIS — Z1389 Encounter for screening for other disorder: Secondary | ICD-10-CM | POA: Diagnosis not present

## 2020-02-06 DIAGNOSIS — Z3A17 17 weeks gestation of pregnancy: Secondary | ICD-10-CM | POA: Diagnosis not present

## 2020-02-06 DIAGNOSIS — Z3480 Encounter for supervision of other normal pregnancy, unspecified trimester: Secondary | ICD-10-CM

## 2020-02-06 DIAGNOSIS — R8271 Bacteriuria: Secondary | ICD-10-CM

## 2020-02-06 DIAGNOSIS — Z363 Encounter for antenatal screening for malformations: Secondary | ICD-10-CM

## 2020-02-06 DIAGNOSIS — O34219 Maternal care for unspecified type scar from previous cesarean delivery: Secondary | ICD-10-CM

## 2020-02-06 DIAGNOSIS — Z3482 Encounter for supervision of other normal pregnancy, second trimester: Secondary | ICD-10-CM | POA: Diagnosis not present

## 2020-02-06 DIAGNOSIS — Z331 Pregnant state, incidental: Secondary | ICD-10-CM

## 2020-02-06 DIAGNOSIS — Z98891 History of uterine scar from previous surgery: Secondary | ICD-10-CM

## 2020-02-06 DIAGNOSIS — O99891 Other specified diseases and conditions complicating pregnancy: Secondary | ICD-10-CM

## 2020-02-06 LAB — POCT URINALYSIS DIPSTICK OB
Blood, UA: NEGATIVE
Glucose, UA: NEGATIVE
Ketones, UA: NEGATIVE
Nitrite, UA: POSITIVE
POC,PROTEIN,UA: NEGATIVE

## 2020-02-06 NOTE — Patient Instructions (Signed)
Joyce Ball, I greatly value your feedback.  If you receive a survey following your visit with [place] today, we appreciate you taking the time to fill it out.  Thanks, Philipp Deputy, CNM  [facility] ([address] [place], [place] [address]) Entrance C, located off of [address] valet parking  Go to [email or web address] to register for FREE online childbirth classes  [place] Pediatricians/Family Doctors:  [facility] [phone]            [facility] [phone]                 [facility] [phone] (usually not accepting new patients unless you have family there already, you are always welcome to call and ask)       [facility] [phone]       [facility] Pediatricians/Family Doctors:   [facility]: [phone]  [facility]: [phone]  [facility]: [phone]  [facility] Doctors:   [facility]: [phone]   [facility]: [phone]  [facility] Doctors:  [facility]: [phone]    Home Blood Pressure Monitoring for Patients   Your provider has recommended that you check your blood pressure (BP) at least once a week at home. If you do not have a blood pressure cuff at home, one will be provided for you. Contact your provider if you have not received your monitor within 1 week.   Helpful Tips for Accurate Home Blood Pressure Checks  . Don't smoke, exercise, or drink caffeine 30 minutes before checking your BP . Use the restroom before checking your BP (a full bladder can raise your pressure) . Relax in a comfortable upright chair . Feet on the ground . Left arm resting comfortably on a flat surface at the level of your heart . Legs uncrossed . Back supported . Sit quietly and don't talk . Place the cuff on your bare arm . Adjust snuggly, so that only  two fingertips can fit between your skin and the top of the cuff . Check 2 readings separated by at least one minute . Keep a log of your BP readings . For a visual, please reference this diagram: http://ccnc.care/bpdiagram  Provider Name: [facility]     Phone: [phone]  Zone 1: ALL CLEAR  Continue to monitor your symptoms:  . BP reading is less than 140 (top number) or less than 90 (bottom number)  . No right upper stomach pain . No headaches or seeing spots . No feeling nauseated or throwing up . No swelling in face and hands  Zone 2: CAUTION Call your doctor's office for any of the following:  . BP reading is greater than 140 (top number) or greater than 90 (bottom number)  . Stomach pain under your ribs in the middle or right side . Headaches or seeing spots . Feeling nauseated or throwing up . Swelling in face and hands  Zone 3: EMERGENCY  Seek immediate medical care if you have any of the following:  . BP reading is greater than160 (top number) or greater than 110 (bottom number) . Severe headaches not improving with Tylenol . Serious difficulty catching your breath . Any worsening symptoms from Zone 2     Second Trimester of Pregnancy The second trimester is from week 14 through week 27 (months 4 through 6). The second trimester is often a time when you feel your best. Your  body has adjusted to being pregnant, and you begin to feel better physically. Usually, morning sickness has lessened or quit completely, you may have more energy, and you may have an increase in appetite. The second trimester is also a time when the fetus is growing rapidly. At the end of the sixth month, the fetus is about 9 inches long and weighs about 1 pounds. You will likely begin to feel the baby move (quickening) between 16 and 20 weeks of pregnancy. Body changes during your second trimester Your body continues to go through many changes during your second trimester. The changes vary  from woman to woman.  Your weight will continue to increase. You will notice your lower abdomen bulging out.  You may begin to get stretch marks on your hips, abdomen, and breasts.  You may develop headaches that can be relieved by medicines. The medicines should be approved by your health care provider.  You may urinate more often because the fetus is pressing on your bladder.  You may develop or continue to have heartburn as a result of your pregnancy.  You may develop constipation because certain hormones are causing the muscles that push waste through your intestines to slow down.  You may develop hemorrhoids or swollen, bulging veins (varicose veins).  You may have back pain. This is caused by: ? Weight gain. ? Pregnancy hormones that are relaxing the joints in your pelvis. ? A shift in weight and the muscles that support your balance.  Your breasts will continue to grow and they will continue to become tender.  Your gums may bleed and may be sensitive to brushing and flossing.  Dark spots or blotches (chloasma, mask of pregnancy) may develop on your face. This will likely fade after the baby is born.  A dark line from your belly button to the pubic area (linea nigra) may appear. This will likely fade after the baby is born.  You may have changes in your hair. These can include thickening of your hair, rapid growth, and changes in texture. Some women also have hair loss during or after pregnancy, or hair that feels dry or thin. Your hair will most likely return to normal after your baby is born.  What to expect at prenatal visits During a routine prenatal visit:  You will be weighed to make sure you and the fetus are growing normally.  Your blood pressure will be taken.  Your abdomen will be measured to track your baby's growth.  The fetal heartbeat will be listened to.  Any test results from the previous visit will be discussed.  Your health care provider may ask  you:  How you are feeling.  If you are feeling the baby move.  If you have had any abnormal symptoms, such as leaking fluid, bleeding, severe headaches, or abdominal cramping.  If you are using any tobacco products, including cigarettes, chewing tobacco, and electronic cigarettes.  If you have any questions.  Other tests that may be performed during your second trimester include:  Blood tests that check for: ? Low iron levels (anemia). ? High blood sugar that affects pregnant women (gestational diabetes) between 78 and 28 weeks. ? Rh antibodies. This is to check for a protein on red blood cells (Rh factor).  Urine tests to check for infections, diabetes, or protein in the urine.  An ultrasound to confirm the proper growth and development of the baby.  An amniocentesis to check for possible genetic problems.  Fetal screens for  spina bifida and Down syndrome.  HIV (human immunodeficiency virus) testing. Routine prenatal testing includes screening for HIV, unless you choose not to have this test.  Follow these instructions at home: Medicines  Follow your health care provider's instructions regarding medicine use. Specific medicines may be either safe or unsafe to take during pregnancy.  Take a prenatal vitamin that contains at least 600 micrograms (mcg) of folic acid.  If you develop constipation, try taking a stool softener if your health care provider approves. Eating and drinking  Eat a balanced diet that includes fresh fruits and vegetables, whole grains, good sources of protein such as meat, eggs, or tofu, and low-fat dairy. Your health care provider will help you determine the amount of weight gain that is right for you.  Avoid raw meat and uncooked cheese. These carry germs that can cause birth defects in the baby.  If you have low calcium intake from food, talk to your health care provider about whether you should take a daily calcium supplement.  Limit foods that  are high in fat and processed sugars, such as fried and sweet foods.  To prevent constipation: ? Drink enough fluid to keep your urine clear or pale yellow. ? Eat foods that are high in fiber, such as fresh fruits and vegetables, whole grains, and beans. Activity  Exercise only as directed by your health care provider. Most women can continue their usual exercise routine during pregnancy. Try to exercise for 30 minutes at least 5 days a week. Stop exercising if you experience uterine contractions.  Avoid heavy lifting, wear low heel shoes, and practice good posture.  A sexual relationship may be continued unless your health care provider directs you otherwise. Relieving pain and discomfort  Wear a good support bra to prevent discomfort from breast tenderness.  Take warm sitz baths to soothe any pain or discomfort caused by hemorrhoids. Use hemorrhoid cream if your health care provider approves.  Rest with your legs elevated if you have leg cramps or low back pain.  If you develop varicose veins, wear support hose. Elevate your feet for 15 minutes, 3-4 times a day. Limit salt in your diet. Prenatal Care  Write down your questions. Take them to your prenatal visits.  Keep all your prenatal visits as told by your health care provider. This is important. Safety  Wear your seat belt at all times when driving.  Make a list of emergency phone numbers, including numbers for family, friends, the hospital, and police and fire departments. General instructions  Ask your health care provider for a referral to a local prenatal education class. Begin classes no later than the beginning of month 6 of your pregnancy.  Ask for help if you have counseling or nutritional needs during pregnancy. Your health care provider can offer advice or refer you to specialists for help with various needs.  Do not use hot tubs, steam rooms, or saunas.  Do not douche or use tampons or scented sanitary  pads.  Do not cross your legs for long periods of time.  Avoid cat litter boxes and soil used by cats. These carry germs that can cause birth defects in the baby and possibly loss of the fetus by miscarriage or stillbirth.  Avoid all smoking, herbs, alcohol, and unprescribed drugs. Chemicals in these products can affect the formation and growth of the baby.  Do not use any products that contain nicotine or tobacco, such as cigarettes and e-cigarettes. If you need help quitting, ask  your health care provider.  Visit your dentist if you have not gone yet during your pregnancy. Use a soft toothbrush to brush your teeth and be gentle when you floss. Contact a health care provider if:  You have dizziness.  You have mild pelvic cramps, pelvic pressure, or nagging pain in the abdominal area.  You have persistent nausea, vomiting, or diarrhea.  You have a bad smelling vaginal discharge.  You have pain when you urinate. Get help right away if:  You have a fever.  You are leaking fluid from your vagina.  You have spotting or bleeding from your vagina.  You have severe abdominal cramping or pain.  You have rapid weight gain or weight loss.  You have shortness of breath with chest pain.  You notice sudden or extreme swelling of your face, hands, ankles, feet, or legs.  You have not felt your baby move in over an hour.  You have severe headaches that do not go away when you take medicine.  You have vision changes. Summary  The second trimester is from week 14 through week 27 (months 4 through 6). It is also a time when the fetus is growing rapidly.  Your body goes through many changes during pregnancy. The changes vary from woman to woman.  Avoid all smoking, herbs, alcohol, and unprescribed drugs. These chemicals affect the formation and growth your baby.  Do not use any tobacco products, such as cigarettes, chewing tobacco, and e-cigarettes. If you need help quitting, ask your  health care provider.  Contact your health care provider if you have any questions. Keep all prenatal visits as told by your health care provider. This is important. This information is not intended to replace advice given to you by your health care provider. Make sure you discuss any questions you have with your health care provider. Document Released: 04/27/2001 Document Revised: 10/09/2015 Document Reviewed: 07/04/2012 Elsevier Interactive Patient Education  2017 [employer].

## 2020-02-06 NOTE — Progress Notes (Signed)
   LOW-RISK PREGNANCY VISIT Patient name: Joyce Ball MRN [id number]  Date of birth: 1995/11/02 Chief Complaint:   Routine Prenatal Visit  History of Present Illness:   Joyce Ball is a 24 y.o. G59P1001 female at [redacted]w[redacted]d with an Estimated Date of Delivery: 07/11/20 being seen today for ongoing management of a low-risk pregnancy.  Today she reports doing well; was able to complete Macrobid as directed; no s/s UTI; has decided upon having a repeat C/S . Contractions: Not present. Vag. Bleeding: None.  Movement: Present. denies leaking of fluid. Review of Systems:   Pertinent items are noted in HPI Denies abnormal vaginal discharge w/ itching/odor/irritation, headaches, visual changes, shortness of breath, chest pain, abdominal pain, severe nausea/vomiting, or problems with urination or bowel movements unless otherwise stated above. Pertinent History Reviewed:  Reviewed past medical,surgical, social, obstetrical and family history.  Reviewed problem list, medications and allergies. Physical Assessment:   Vitals:   02/06/20 1106  BP: 117/69  Pulse: 83  Weight: 201 lb (91.2 kg)  Body mass index is 36.76 kg/m.        Physical Examination:   General appearance: Well appearing, and in no distress  Mental status: Alert, oriented to person, place, and time  Skin: Warm & dry  Cardiovascular: Normal heart rate noted  Respiratory: Normal respiratory effort, no distress  Abdomen: Soft, gravid, nontender  Pelvic: Cervical exam deferred         Extremities:    Fetal Status: Fetal Heart Rate (bpm): 142 u/s   Movement: Present     Anatomy u/s: [place] 17+5 wks,breech,cx 3.3 cm,posterior placenta gr 0,normal ovaries,LVEICF 1.8 mm,svp of fluid 5.4 cm,fhr 142 bpm,EFW 227 G 71%,anatomy complete  Results for orders placed or performed in visit on 02/06/20 (from the past 24 hour(s))  POC Urinalysis Dipstick OB   Collection Time: 02/06/20 11:02 AM  Result Value Ref Range   Color, UA     Clarity, UA      Glucose, UA Negative Negative   Bilirubin, UA     Ketones, UA neg    Spec Grav, UA     Blood, UA neg    pH, UA     POC,PROTEIN,UA Negative Negative, Trace, Small (1+), Moderate (2+), Large (3+), 4+   Urobilinogen, UA     Nitrite, UA positive    Leukocytes, UA Small (1+) (A) Negative   Appearance     Odor      Assessment & Plan:  1) Low-risk pregnancy G2P1001 at [redacted]w[redacted]d with an Estimated Date of Delivery: 07/11/20   2) LVEICF 1.23mm, no f/u needed  3) Needs ASB POC, and +nitrites on UA, for culture   Meds: No orders of the defined types were placed in this encounter.  Labs/procedures today: anatomy u/s; 2nd IT; urine culture  Plan:  Continue routine obstetrical care   Reviewed: Preterm labor symptoms and general obstetric precautions including but not limited to vaginal bleeding, contractions, leaking of fluid and fetal movement were reviewed in detail with the patient.  All questions were answered. Has home bp cuff. Check bp weekly, let us know if >140/90.   Follow-up: Return in about 4 weeks (around 03/05/2020) for LROB, in person.  Orders Placed This Encounter  Procedures  . Culture, OB Urine  . INTEGRATED 2  . POC Urinalysis Dipstick OB   Arabella Merles [facility] 02/06/2020 11:32 AM

## 2020-02-06 NOTE — Progress Notes (Signed)
[place] 17+5 wks,breech,cx 3.3 cm,posterior placenta gr 0,normal ovaries,LVEICF 1.8 mm,svp of fluid 5.4 cm,fhr 142 bpm,EFW 227 G 71%,anatomy complete

## 2020-02-08 LAB — INTEGRATED 2
AFP MoM: 0.9
Alpha-Fetoprotein: 28.7 ng/mL
Crown Rump Length: 64.1 mm
DIA MoM: 1.61
DIA Value: 210.9 pg/mL
Estriol, Unconjugated: 1.19 ng/mL
Gest. Age on Collection Date: 12.6 weeks
Gestational Age: 17.4 weeks
Maternal Age at EDD: 23.8 yr
Nuchal Translucency (NT): 1.6 mm
Nuchal Translucency MoM: 1.11
Number of Fetuses: 1
PAPP-A MoM: 2.09
PAPP-A Value: 1534.4 ng/mL
Test Results:: NEGATIVE
Weight: 198 [lb_av]
Weight: 198 [lb_av]
hCG MoM: 3.05
hCG Value: 68.3 IU/mL
uE3 MoM: 1.02

## 2020-02-09 ENCOUNTER — Other Ambulatory Visit: Payer: Self-pay | Admitting: Advanced Practice Midwife

## 2020-02-09 DIAGNOSIS — R8271 Bacteriuria: Secondary | ICD-10-CM

## 2020-02-09 LAB — CULTURE, OB URINE

## 2020-02-09 LAB — URINE CULTURE, OB REFLEX

## 2020-02-09 MED ORDER — TRIMETHOPRIM 100 MG PO TABS: 100.0000 mg | ORAL_TABLET | Freq: Two times a day (BID) | ORAL | 0 refills | Status: AC

## 2020-02-09 NOTE — Progress Notes (Unsigned)
.  ke

## 2020-02-11 ENCOUNTER — Telehealth: Payer: Self-pay | Admitting: *Deleted

## 2020-02-11 NOTE — Telephone Encounter (Signed)
Attempted to call patient regarding urine results.  Unable to leave message.

## 2020-02-12 ENCOUNTER — Telehealth: Payer: Self-pay | Admitting: *Deleted

## 2020-02-12 NOTE — Telephone Encounter (Signed)
Attempted to call patient and leave message. Unable to leave message.

## 2020-02-13 ENCOUNTER — Telehealth: Payer: Self-pay | Admitting: *Deleted

## 2020-02-13 NOTE — Telephone Encounter (Signed)
Unable to reach patient. No voicemail.

## 2020-02-19 ENCOUNTER — Encounter: Payer: Self-pay | Admitting: *Deleted

## 2020-02-19 DIAGNOSIS — Z3480 Encounter for supervision of other normal pregnancy, unspecified trimester: Secondary | ICD-10-CM

## 2020-03-05 ENCOUNTER — Encounter: Payer: Self-pay | Admitting: Advanced Practice Midwife

## 2020-03-05 ENCOUNTER — Ambulatory Visit (INDEPENDENT_AMBULATORY_CARE_PROVIDER_SITE_OTHER): Payer: Medicaid Other | Admitting: Advanced Practice Midwife

## 2020-03-05 VITALS — BP 107/66 | HR 80 | Wt 202.5 lb

## 2020-03-05 DIAGNOSIS — O99891 Other specified diseases and conditions complicating pregnancy: Secondary | ICD-10-CM

## 2020-03-05 DIAGNOSIS — Z1389 Encounter for screening for other disorder: Secondary | ICD-10-CM

## 2020-03-05 DIAGNOSIS — Z3480 Encounter for supervision of other normal pregnancy, unspecified trimester: Secondary | ICD-10-CM

## 2020-03-05 DIAGNOSIS — Z3A21 21 weeks gestation of pregnancy: Secondary | ICD-10-CM | POA: Diagnosis not present

## 2020-03-05 DIAGNOSIS — R8271 Bacteriuria: Secondary | ICD-10-CM | POA: Diagnosis not present

## 2020-03-05 DIAGNOSIS — Z331 Pregnant state, incidental: Secondary | ICD-10-CM

## 2020-03-05 DIAGNOSIS — Z302 Encounter for sterilization: Secondary | ICD-10-CM

## 2020-03-05 LAB — POCT URINALYSIS DIPSTICK OB
Blood, UA: NEGATIVE
Glucose, UA: NEGATIVE
Ketones, UA: NEGATIVE
Leukocytes, UA: NEGATIVE
Nitrite, UA: NEGATIVE
POC,PROTEIN,UA: NEGATIVE

## 2020-03-05 NOTE — Progress Notes (Signed)
   LOW-RISK PREGNANCY VISIT Patient name: Joyce Ball MRN [id number]  Date of birth: Aug 28, 1995 Chief Complaint:   Routine Prenatal Visit  History of Present Illness:   Joyce Ball is a 24 y.o. G52P1001 female at [redacted]w[redacted]d with an Estimated Date of Delivery: 07/11/20 being seen today for ongoing management of a low-risk pregnancy.   Today she reports feeling well; desires BTL; still planning on rLTCS; denies UTI s/s and was able to complete abx. Contractions: Not present. Vag. Bleeding: None.  Movement: Present. denies leaking of fluid. Review of Systems:   Pertinent items are noted in HPI Denies abnormal vaginal discharge w/ itching/odor/irritation, headaches, visual changes, shortness of breath, chest pain, abdominal pain, severe nausea/vomiting, or problems with urination or bowel movements unless otherwise stated above. Pertinent History Reviewed:  Reviewed past medical,surgical, social, obstetrical and family history.  Reviewed problem list, medications and allergies. Physical Assessment:   Vitals:   03/05/20 1009  BP: 107/66  Pulse: 80  Weight: 202 lb 8 oz (91.9 kg)  Body mass index is 37.04 kg/m.        Physical Examination:   General appearance: Well appearing, and in no distress  Mental status: Alert, oriented to person, place, and time  Skin: Warm & dry  Cardiovascular: Normal heart rate noted  Respiratory: Normal respiratory effort, no distress  Abdomen: Soft, gravid, nontender  Pelvic: Cervical exam deferred         Extremities: Edema: None  Fetal Status: Fetal Heart Rate (bpm): 156   Movement: Present    Results for orders placed or performed in visit on 03/05/20 (from the past 24 hour(s))  POC Urinalysis Dipstick OB   Collection Time: 03/05/20 10:12 AM  Result Value Ref Range   Color, UA     Clarity, UA     Glucose, UA Negative Negative   Bilirubin, UA     Ketones, UA neg    Spec Grav, UA     Blood, UA neg    pH, UA     POC,PROTEIN,UA Negative  Negative, Trace, Small (1+), Moderate (2+), Large (3+), 4+   Urobilinogen, UA     Nitrite, UA neg    Leukocytes, UA Negative Negative   Appearance     Odor      Assessment & Plan:  1) Low-risk pregnancy G2P1001 at [redacted]w[redacted]d with an Estimated Date of Delivery: 07/11/20   2) Desires BTL, reviewed LARC information and declines; understands permanency and would like to sign 30 day papers after 28wks  3) ASB x 2, urine for POC today  4) Requesting circ info, went to send via MyChart after visit, and pt doesn't have it set up> will give printed info at next visit   Meds: No orders of the defined types were placed in this encounter.  Labs/procedures today: none  Plan:  Continue routine obstetrical care with PN2 at next visit  Reviewed: Preterm labor symptoms and general obstetric precautions including but not limited to vaginal bleeding, contractions, leaking of fluid and fetal movement were reviewed in detail with the patient.  All questions were answered. Has home bp cuff. Check bp weekly, let us know if >140/90.   Follow-up: Return in about 30 days (around 04/04/2020) for LROB, PN2, in person (11/19 or after).  Orders Placed This Encounter  Procedures  . Urine Culture  . POC Urinalysis Dipstick OB   Arabella Merles [facility] 03/05/2020 10:44 AM

## 2020-03-05 NOTE — Patient Instructions (Signed)
Joyce Ball, I greatly value your feedback.  If you receive a survey following your visit with [place] today, we appreciate you taking the time to fill it out.  Thanks, Joyce Ball, CNM   You will have your sugar test next visit.  Please do not eat or drink anything after midnight the night before you come, not even water.  You will be here for at least two hours.  Please make an appointment online for the bloodwork at [email or web address] for 8:30am (or as close to this as possible). Make sure you select the [facility] service center. The day of the appointment, check in with our office first, then you will go to [facility] to start the sugar test.    [facility] HAS MOVED!!! It is now [facility] ([address] [place], [place] [address]) Entrance C, located off of [address] valet parking  Go to [email or web address] to register for FREE online childbirth classes   Call the office [phone]) or go to [facility] if:  You begin to have strong, frequent contractions  Your water breaks.  Sometimes it is a big gush of fluid, sometimes it is just a trickle that keeps getting your panties wet or running down your legs  You have vaginal bleeding.  It is normal to have a small amount of spotting if your cervix was checked.   You don't feel your baby moving like normal.  If you don't, get you something to eat and drink and lay down and focus on feeling your baby move.   If your baby is still not moving like normal, you should call the office or go to [facility].  [place] Pediatricians/Family Doctors:  [facility] [phone]            [facility] [phone]                 [facility] [phone] (usually not accepting new patients unless you have family there already, you are always welcome to call and ask)       [facility] Department [phone]       [facility] Pediatricians/Family Doctors:    [facility]: [phone]  [facility]: [phone]  [facility]: [phone]  [facility] Doctors:   [facility]: [phone]   [facility]: [phone]  [facility] Doctors:  [facility]: [phone]   Home Blood Pressure Monitoring for Patients   Your provider has recommended that you check your blood pressure (BP) at least once a week at home. If you do not have a blood pressure cuff at home, one will be provided for you. Contact your provider if you have not received your monitor within 1 week.   Helpful Tips for Accurate Home Blood Pressure Checks  . Don't smoke, exercise, or drink caffeine 30 minutes before checking your BP . Use the restroom before checking your BP (a full bladder can raise your pressure) . Relax in a comfortable upright chair . Feet on the ground . Left arm resting comfortably on a flat surface at the level of your heart . Legs uncrossed . Back supported . Sit quietly and don't talk . Place the cuff on your bare arm . Adjust snuggly, so that only two fingertips can fit between your skin and the top of the cuff . Check 2 readings separated by at least one minute . Keep a log of your BP readings .  For a visual, please reference this diagram: http://ccnc.care/bpdiagram  Provider Name: [facility]     Phone: [phone]  Zone 1: ALL CLEAR  Continue to monitor your symptoms:  . BP reading is less than 140 (top number) or less than 90 (bottom number)  . No right upper stomach pain . No headaches or seeing spots . No feeling nauseated or throwing up . No swelling in face and hands  Zone 2: CAUTION Call your doctor's office for any of the following:  . BP reading is greater than 140 (top number) or greater than 90 (bottom number)  . Stomach pain under your ribs in the middle or right side . Headaches or seeing spots . Feeling  nauseated or throwing up . Swelling in face and hands  Zone 3: EMERGENCY  Seek immediate medical care if you have any of the following:  . BP reading is greater than160 (top number) or greater than 110 (bottom number) . Severe headaches not improving with Tylenol . Serious difficulty catching your breath . Any worsening symptoms from Zone 2   Second Trimester of Pregnancy The second trimester is from week 13 through week 28, months 4 through 6. The second trimester is often a time when you feel your best. Your body has also adjusted to being pregnant, and you begin to feel better physically. Usually, morning sickness has lessened or quit completely, you may have more energy, and you may have an increase in appetite. The second trimester is also a time when the fetus is growing rapidly. At the end of the sixth month, the fetus is about 9 inches long and weighs about 1 pounds. You will likely begin to feel the baby move (quickening) between 18 and 20 weeks of the pregnancy. BODY CHANGES Your body goes through many changes during pregnancy. The changes vary from woman to woman.   Your weight will continue to increase. You will notice your lower abdomen bulging out.  You may begin to get stretch marks on your hips, abdomen, and breasts.  You may develop headaches that can be relieved by medicines approved by your health care provider.  You may urinate more often because the fetus is pressing on your bladder.  You may develop or continue to have heartburn as a result of your pregnancy.  You may develop constipation because certain hormones are causing the muscles that push waste through your intestines to slow down.  You may develop hemorrhoids or swollen, bulging veins (varicose veins).  You may have back pain because of the weight gain and pregnancy hormones relaxing your joints between the bones in your pelvis and as a result of a shift in weight and the muscles that support your  balance.  Your breasts will continue to grow and be tender.  Your gums may bleed and may be sensitive to brushing and flossing.  Dark spots or blotches (chloasma, mask of pregnancy) may develop on your face. This will likely fade after the baby is born.  A dark line from your belly button to the pubic area (linea nigra) may appear. This will likely fade after the baby is born.  You may have changes in your hair. These can include thickening of your hair, rapid growth, and changes in texture. Some women also have hair loss during or after pregnancy, or hair that feels dry or thin. Your hair will most likely return to normal after your baby is born. WHAT TO EXPECT AT YOUR PRENATAL VISITS During a routine  prenatal visit:  You will be weighed to make sure you and the fetus are growing normally.  Your blood pressure will be taken.  Your abdomen will be measured to track your baby's growth.  The fetal heartbeat will be listened to.  Any test results from the previous visit will be discussed. Your health care provider may ask you:  How you are feeling.  If you are feeling the baby move.  If you have had any abnormal symptoms, such as leaking fluid, bleeding, severe headaches, or abdominal cramping.  If you have any questions. Other tests that may be performed during your second trimester include:  Blood tests that check for:  Low iron levels (anemia).  Gestational diabetes (between 24 and 28 weeks).  Rh antibodies.  Urine tests to check for infections, diabetes, or protein in the urine.  An ultrasound to confirm the proper growth and development of the baby.  An amniocentesis to check for possible genetic problems.  Fetal screens for spina bifida and Down syndrome. HOME CARE INSTRUCTIONS   Avoid all smoking, herbs, alcohol, and unprescribed drugs. These chemicals affect the formation and growth of the baby.  Follow your health care provider's instructions regarding  medicine use. There are medicines that are either safe or unsafe to take during pregnancy.  Exercise only as directed by your health care provider. Experiencing uterine cramps is a good sign to stop exercising.  Continue to eat regular, healthy meals.  Wear a good support bra for breast tenderness.  Do not use hot tubs, steam rooms, or saunas.  Wear your seat belt at all times when driving.  Avoid raw meat, uncooked cheese, cat litter boxes, and soil used by cats. These carry germs that can cause birth defects in the baby.  Take your prenatal vitamins.  Try taking a stool softener (if your health care provider approves) if you develop constipation. Eat more high-fiber foods, such as fresh vegetables or fruit and whole grains. Drink plenty of fluids to keep your urine clear or pale yellow.  Take warm sitz baths to soothe any pain or discomfort caused by hemorrhoids. Use hemorrhoid cream if your health care provider approves.  If you develop varicose veins, wear support hose. Elevate your feet for 15 minutes, 3-4 times a day. Limit salt in your diet.  Avoid heavy lifting, wear low heel shoes, and practice good posture.  Rest with your legs elevated if you have leg cramps or low back pain.  Visit your dentist if you have not gone yet during your pregnancy. Use a soft toothbrush to brush your teeth and be gentle when you floss.  A sexual relationship may be continued unless your health care provider directs you otherwise.  Continue to go to all your prenatal visits as directed by your health care provider. SEEK MEDICAL CARE IF:   You have dizziness.  You have mild pelvic cramps, pelvic pressure, or nagging pain in the abdominal area.  You have persistent nausea, vomiting, or diarrhea.  You have a bad smelling vaginal discharge.  You have pain with urination. SEEK IMMEDIATE MEDICAL CARE IF:   You have a fever.  You are leaking fluid from your vagina.  You have spotting or  bleeding from your vagina.  You have severe abdominal cramping or pain.  You have rapid weight gain or loss.  You have shortness of breath with chest pain.  You notice sudden or extreme swelling of your face, hands, ankles, feet, or legs.  You have not  felt your baby move in over an hour.  You have severe headaches that do not go away with medicine.  You have vision changes. Document Released: 04/27/2001 Document Revised: 05/08/2013 Document Reviewed: 07/04/2012 [facility] Patient Information 2015 [place], [place]. This information is not intended to replace advice given to you by your health care provider. Make sure you discuss any questions you have with your health care provider.

## 2020-03-07 LAB — URINE CULTURE

## 2020-04-04 ENCOUNTER — Other Ambulatory Visit: Payer: Medicaid Other

## 2020-04-04 ENCOUNTER — Other Ambulatory Visit: Payer: Self-pay

## 2020-04-04 ENCOUNTER — Ambulatory Visit (INDEPENDENT_AMBULATORY_CARE_PROVIDER_SITE_OTHER): Payer: Medicaid Other | Admitting: Advanced Practice Midwife

## 2020-04-04 VITALS — BP 114/64 | HR 84 | Wt 201.2 lb

## 2020-04-04 DIAGNOSIS — Z3480 Encounter for supervision of other normal pregnancy, unspecified trimester: Secondary | ICD-10-CM

## 2020-04-04 DIAGNOSIS — Z1389 Encounter for screening for other disorder: Secondary | ICD-10-CM

## 2020-04-04 DIAGNOSIS — Z3A26 26 weeks gestation of pregnancy: Secondary | ICD-10-CM | POA: Diagnosis not present

## 2020-04-04 DIAGNOSIS — Z331 Pregnant state, incidental: Secondary | ICD-10-CM

## 2020-04-04 LAB — POCT URINALYSIS DIPSTICK OB
Blood, UA: NEGATIVE
Glucose, UA: NEGATIVE
Ketones, UA: NEGATIVE
Nitrite, UA: NEGATIVE
POC,PROTEIN,UA: NEGATIVE

## 2020-04-04 NOTE — Patient Instructions (Signed)
Deciding about Circumcision in Baby Boys  (The Basics)  What is circumcision?   Circumcision is a surgery that removes the skin that covers the tip of the penis, called the "foreskin" Circumcision is usually done when a boy is between 1 and 10 days old. In the [place], circumcision is common. In some other countries, fewer boys are circumcised. Circumcision is a common tradition in some religions.  Should I have my baby boy circumcised?   There is no easy answer. Circumcision has some benefits. But it also has risks. After talking with your doctor, you will have to decide for yourself what is right for your family.  What are the benefits of circumcision?   Circumcised boys seem to have slightly lower rates of: ?Urinary tract infections ?Swelling of the opening at the tip of the penis Circumcised men seem to have slightly lower rates of: ?Urinary tract infections ?Swelling of the opening at the tip of the penis ?Penis cancer ?HIV and other infections that you catch during sex ?Cervical cancer in the women they have sex with Even so, in the [place], the risks of these problems are small - even in boys and men who have not been circumcised. Plus, boys and men who are not circumcised can reduce these extra risks by: ?Cleaning their penis well ?Using condoms during sex  What are the risks of circumcision?  Risks include: ?Bleeding or infection from the surgery ?Damage to or amputation of the penis ?A chance that the doctor will cut off too much or not enough of the foreskin ?A chance that sex won't feel as good later in life Only about 1 out of every 200 circumcisions leads to problems. There is also a chance that your health insurance won't pay for circumcision.  How is circumcision done in baby boys?  First, the baby gets medicine for pain relief. This might be a cream on the skin or a shot into the base of the penis. Next, the doctor cleans the baby's penis well. Then  he or she uses special tools to cut off the foreskin. Finally, the doctor wraps a bandage (called gauze) around the baby's penis. If you have your baby circumcised, his doctor or nurse will give you instructions on how to care for him after the surgery. It is important that you follow those instructions carefully.  

## 2020-04-04 NOTE — Progress Notes (Signed)
   LOW-RISK PREGNANCY VISIT Patient name: Joyce Ball MRN [id number]  Date of birth: 06-02-1995 Chief Complaint:   Routine Prenatal Visit  History of Present Illness:   Joyce Ball is a 24 y.o. G56P1001 female at [redacted]w[redacted]d with an Estimated Date of Delivery: 07/11/20 being seen today for ongoing management of a low-risk pregnancy.  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 Contractions: Not present. Vag. Bleeding: None.  Movement: Present. denies leaking of fluid. Review of Systems:   Pertinent items are noted in HPI Denies abnormal vaginal discharge w/ itching/odor/irritation, headaches, visual changes, shortness of breath, chest pain, abdominal pain, severe nausea/vomiting, or problems with urination or bowel movements unless otherwise stated above. Pertinent History Reviewed:  Reviewed past medical,surgical, social, obstetrical and family history.  Reviewed problem list, medications and allergies. Physical Assessment:   Vitals:   04/04/20 0905  BP: 114/64  Pulse: 84  Weight: 201 lb 3.2 oz (91.3 kg)  Body mass index is 36.8 kg/m.        Physical Examination:   General appearance: Well appearing, and in no distress  Mental status: Alert, oriented to person, place, and time  Skin: Warm & dry  Cardiovascular: Normal heart rate noted  Respiratory: Normal respiratory effort, no distress  Abdomen: Soft, gravid, nontender  Pelvic: Cervical exam deferred         Extremities: Edema: Trace  Fetal Status: Fetal Heart Rate (bpm): 151 Fundal Height: 26 cm Movement: Present    Results for orders placed or performed in visit on 04/04/20 (from the past 24 hour(s))  POC Urinalysis Dipstick OB   Collection Time: 04/04/20  9:05 AM  Result Value Ref Range   Color, UA      Clarity, UA     Glucose, UA Negative Negative   Bilirubin, UA     Ketones, UA neg    Spec Grav, UA     Blood, UA neg    pH, UA     POC,PROTEIN,UA Negative Negative, Trace, Small (1+), Moderate (2+), Large (3+), 4+   Urobilinogen, UA     Nitrite, UA neg    Leukocytes, UA Moderate (2+) (A) Negative   Appearance     Odor      Assessment & Plan:  1) Low-risk pregnancy G2P1001 at [redacted]w[redacted]d with an Estimated Date of Delivery: 07/11/20   2) Prev C/S, plans repeat @ 39wks  3) Bending/lifting @ work, will speak with employer about moving to a different area and I can give a note to support this if needed (this isn't a note to start FMLA)  4) Hx PPH, plan TXA at delivery   Meds: No orders of the defined types were placed in this encounter.  Labs/procedures today: PN2  Plan:  Continue routine obstetrical care   Reviewed: Preterm labor symptoms and general obstetric precautions including but not limited to vaginal bleeding, contractions, leaking of fluid and fetal movement were reviewed in detail with the patient.  All questions were answered. Has home bp cuff.  Check bp weekly, let us know if >140/90.   Follow-up: Return in about 4 weeks (around 05/02/2020) for LROB, in person.  Orders Placed This Encounter  Procedures  . POC Urinalysis Dipstick OB   [facility] 04/04/2020 9:23 AM

## 2020-04-05 LAB — RPR: RPR Ser Ql: NONREACTIVE

## 2020-04-05 LAB — CBC
Hematocrit: 33.5 % — ABNORMAL LOW (ref 34.0–46.6)
Hemoglobin: 11.7 g/dL (ref 11.1–15.9)
MCH: 32.9 pg (ref 26.6–33.0)
MCHC: 34.9 g/dL (ref 31.5–35.7)
MCV: 94 fL (ref 79–97)
Platelets: 225 10*3/uL (ref 150–450)
RBC: 3.56 x10E6/uL — ABNORMAL LOW (ref 3.77–5.28)
RDW: 12.1 % (ref 11.7–15.4)
WBC: 4.7 10*3/uL (ref 3.4–10.8)

## 2020-04-05 LAB — GLUCOSE TOLERANCE, 2 HOURS W/ 1HR
Glucose, 1 hour: 118 mg/dL (ref 65–179)
Glucose, 2 hour: 110 mg/dL (ref 65–152)
Glucose, Fasting: 96 mg/dL — ABNORMAL HIGH (ref 65–91)

## 2020-04-05 LAB — ANTIBODY SCREEN: Antibody Screen: NEGATIVE

## 2020-04-05 LAB — HIV ANTIBODY (ROUTINE TESTING W REFLEX): HIV Screen 4th Generation wRfx: NONREACTIVE

## 2020-04-07 ENCOUNTER — Encounter: Payer: Self-pay | Admitting: Women's Health

## 2020-04-07 ENCOUNTER — Other Ambulatory Visit: Payer: Self-pay | Admitting: *Deleted

## 2020-04-07 DIAGNOSIS — O24410 Gestational diabetes mellitus in pregnancy, diet controlled: Secondary | ICD-10-CM

## 2020-04-07 DIAGNOSIS — Z8632 Personal history of gestational diabetes: Secondary | ICD-10-CM | POA: Insufficient documentation

## 2020-04-07 DIAGNOSIS — O0990 Supervision of high risk pregnancy, unspecified, unspecified trimester: Secondary | ICD-10-CM

## 2020-04-07 MED ORDER — ACCU-CHEK SOFTCLIX LANCETS MISC: 12 refills | Status: DC

## 2020-04-07 MED ORDER — BLOOD GLUCOSE METER KIT: PACK | 0 refills | Status: DC

## 2020-04-07 MED ORDER — ACCU-CHEK GUIDE ME W/DEVICE KIT: 1.0000 | PACK | Freq: Four times a day (QID) | 0 refills | Status: DC

## 2020-04-07 MED ORDER — ACCU-CHEK GUIDE VI STRP: ORAL_STRIP | 12 refills | Status: DC

## 2020-04-09 ENCOUNTER — Telehealth: Payer: Self-pay | Admitting: *Deleted

## 2020-04-09 NOTE — Telephone Encounter (Signed)
Patient informed she has gestational diabetes and will need to check blood sugar 4 times daily.  Supplies have been sent to pharmacy.  Pt verbalized understanding with no further questions.

## 2020-04-16 ENCOUNTER — Other Ambulatory Visit: Payer: Self-pay

## 2020-04-16 ENCOUNTER — Encounter: Payer: Medicaid Other | Attending: Advanced Practice Midwife | Admitting: Registered"

## 2020-04-16 ENCOUNTER — Encounter: Payer: Self-pay | Admitting: Registered"

## 2020-04-16 DIAGNOSIS — O24410 Gestational diabetes mellitus in pregnancy, diet controlled: Secondary | ICD-10-CM | POA: Diagnosis not present

## 2020-04-16 NOTE — Progress Notes (Signed)
Patient was seen on 04/16/20 for Gestational Diabetes self-management class at [facility]. The following learning objectives were met by the patient during this course:   States the definition of Gestational Diabetes  States why dietary management is important in controlling blood glucose  Describes the effects each nutrient has on blood glucose levels  Demonstrates ability to create a balanced meal plan  Demonstrates carbohydrate counting   States when to check blood glucose levels  Demonstrates proper blood glucose monitoring techniques  States the effect of stress and exercise on blood glucose levels  States the importance of limiting caffeine and abstaining from alcohol and smoking  Blood glucose monitor given: none. Patient has meter and checking   Patient instructed to monitor glucose levels: FBS: 60 - <95; 1 hour: <140; 2 hour: <120  Patient received handouts:  Nutrition Diabetes and Pregnancy, including carb counting list  Patient will be seen for follow-up as needed.

## 2020-04-17 ENCOUNTER — Telehealth: Payer: Self-pay | Admitting: Women's Health

## 2020-04-17 NOTE — Telephone Encounter (Signed)
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

## 2020-04-21 ENCOUNTER — Encounter: Payer: Self-pay | Admitting: *Deleted

## 2020-05-01 ENCOUNTER — Encounter: Payer: Medicaid Other | Admitting: Obstetrics & Gynecology

## 2020-05-06 ENCOUNTER — Encounter: Payer: Self-pay | Admitting: Women's Health

## 2020-05-06 ENCOUNTER — Other Ambulatory Visit: Payer: Self-pay

## 2020-05-06 ENCOUNTER — Ambulatory Visit (INDEPENDENT_AMBULATORY_CARE_PROVIDER_SITE_OTHER): Payer: Medicaid Other | Admitting: Women's Health

## 2020-05-06 VITALS — BP 108/65 | HR 74 | Wt 202.0 lb

## 2020-05-06 DIAGNOSIS — O0990 Supervision of high risk pregnancy, unspecified, unspecified trimester: Secondary | ICD-10-CM

## 2020-05-06 DIAGNOSIS — Z331 Pregnant state, incidental: Secondary | ICD-10-CM

## 2020-05-06 DIAGNOSIS — Z3A30 30 weeks gestation of pregnancy: Secondary | ICD-10-CM

## 2020-05-06 DIAGNOSIS — K429 Umbilical hernia without obstruction or gangrene: Secondary | ICD-10-CM | POA: Insufficient documentation

## 2020-05-06 DIAGNOSIS — O24410 Gestational diabetes mellitus in pregnancy, diet controlled: Secondary | ICD-10-CM

## 2020-05-06 DIAGNOSIS — O0993 Supervision of high risk pregnancy, unspecified, third trimester: Secondary | ICD-10-CM

## 2020-05-06 DIAGNOSIS — Z1389 Encounter for screening for other disorder: Secondary | ICD-10-CM

## 2020-05-06 LAB — POCT URINALYSIS DIPSTICK OB
Glucose, UA: NEGATIVE
Ketones, UA: NEGATIVE
Nitrite, UA: NEGATIVE
POC,PROTEIN,UA: NEGATIVE

## 2020-05-06 NOTE — Patient Instructions (Signed)
Joyce Ball, I greatly value your feedback.  If you receive a survey following your visit with [place] today, we appreciate you taking the time to fill it out.  Thanks, Joellyn Haff, CNM, WHNP-BC  [facility] ([address] [place], [place] [address]) Entrance C, located off of [address] valet parking   Go to [email or web address] to register for FREE online childbirth classes    Call the office [phone]) or go to [facility] if:  You begin to have strong, frequent contractions  Your water breaks.  Sometimes it is a big gush of fluid, sometimes it is just a trickle that keeps getting your panties wet or running down your legs  You have vaginal bleeding.  It is normal to have a small amount of spotting if your cervix was checked.   You don't feel your baby moving like normal.  If you don't, get you something to eat and drink and lay down and focus on feeling your baby move.  You should feel at least 10 movements in 2 hours.  If you don't, you should call the office or go to [facility].   Call the office [phone]) or go to [facility] hospital for these signs of pre-eclampsia:  Severe headache that does not go away with Tylenol  Visual changes- seeing spots, double, blurred vision  Pain under your right breast or upper abdomen that does not go away with Tums or heartburn medicine  Nausea and/or vomiting  Severe swelling in your hands, feet, and face    Home Blood Pressure Monitoring for Patients   Your provider has recommended that you check your blood pressure (BP) at least once a week at home. If you do not have a blood pressure cuff at home, one will be provided for you. Contact your provider if you have not received your monitor within 1 week.   Helpful Tips for Accurate Home Blood Pressure Checks  . Don't smoke, exercise, or drink caffeine 30 minutes before checking your BP . Use the restroom before checking your BP (a full  bladder can raise your pressure) . Relax in a comfortable upright chair . Feet on the ground . Left arm resting comfortably on a flat surface at the level of your heart . Legs uncrossed . Back supported . Sit quietly and don't talk . Place the cuff on your bare arm . Adjust snuggly, so that only two fingertips can fit between your skin and the top of the cuff . Check 2 readings separated by at least one minute . Keep a log of your BP readings . For a visual, please reference this diagram: http://ccnc.care/bpdiagram  Provider Name: [facility]     Phone: [phone]  Zone 1: ALL CLEAR  Continue to monitor your symptoms:  . BP reading is less than 140 (top number) or less than 90 (bottom number)  . No right upper stomach pain . No headaches or seeing spots . No feeling nauseated or throwing up . No swelling in face and hands  Zone 2: CAUTION Call your doctor's office for any of the following:  . BP reading is greater than 140 (top number) or greater than 90 (bottom number)  . Stomach pain under your ribs in the middle or right side . Headaches or seeing spots . Feeling nauseated or throwing up . Swelling in face and hands  Zone 3: EMERGENCY  Seek immediate medical care if you have any of the  following:  . BP reading is greater than160 (top number) or greater than 110 (bottom number) . Severe headaches not improving with Tylenol . Serious difficulty catching your breath . Any worsening symptoms from Zone 2  Preterm Labor and Birth Information  The normal length of a pregnancy is 39-41 weeks. Preterm labor is when labor starts before 37 completed weeks of pregnancy. What are the risk factors for preterm labor? Preterm labor is more likely to occur in women who:  Have certain infections during pregnancy such as a bladder infection, sexually transmitted infection, or infection inside the uterus (chorioamnionitis).  Have a shorter-than-normal cervix.  Have gone into  preterm labor before.  Have had surgery on their cervix.  Are younger than age 41 or older than age 6.  Are African American.  Are pregnant with twins or multiple babies (multiple gestation).  Take street drugs or smoke while pregnant.  Do not gain enough weight while pregnant.  Became pregnant shortly after having been pregnant. What are the symptoms of preterm labor? Symptoms of preterm labor include:  Cramps similar to those that can happen during a menstrual period. The cramps may happen with diarrhea.  Pain in the abdomen or lower back.  Regular uterine contractions that may feel like tightening of the abdomen.  A feeling of increased pressure in the pelvis.  Increased watery or bloody mucus discharge from the vagina.  Water breaking (ruptured amniotic sac). Why is it important to recognize signs of preterm labor? It is important to recognize signs of preterm labor because babies who are born prematurely may not be fully developed. This can put them at an increased risk for:  Long-term (chronic) heart and lung problems.  Difficulty immediately after birth with regulating body systems, including blood sugar, body temperature, heart rate, and breathing rate.  Bleeding in the brain.  Cerebral palsy.  Learning difficulties.  Death. These risks are highest for babies who are born before 21 weeks of pregnancy. How is preterm labor treated? Treatment depends on the length of your pregnancy, your condition, and the health of your baby. It may involve: 1. Having a stitch (suture) placed in your cervix to prevent your cervix from opening too early (cerclage). 2. Taking or being given medicines, such as: ? Hormone medicines. These may be given early in pregnancy to help support the pregnancy. ? Medicine to stop contractions. ? Medicines to help mature the baby's lungs. These may be prescribed if the risk of delivery is high. ? Medicines to prevent your baby from  developing cerebral palsy. If the labor happens before 34 weeks of pregnancy, you may need to stay in the hospital. What should I do if I think I am in preterm labor? If you think that you are going into preterm labor, call your health care provider right away. How can I prevent preterm labor in future pregnancies? To increase your chance of having a full-term pregnancy:  Do not use any tobacco products, such as cigarettes, chewing tobacco, and e-cigarettes. If you need help quitting, ask your health care provider.  Do not use street drugs or medicines that have not been prescribed to you during your pregnancy.  Talk with your health care provider before taking any herbal supplements, even if you have been taking them regularly.  Make sure you gain a healthy amount of weight during your pregnancy.  Watch for infection. If you think that you might have an infection, get it checked right away.  Make sure to tell  your health care provider if you have gone into preterm labor before. This information is not intended to replace advice given to you by your health care provider. Make sure you discuss any questions you have with your health care provider. Document Revised: 08/25/2018 Document Reviewed: 09/24/2015 Elsevier Patient Education  [place].

## 2020-05-06 NOTE — Progress Notes (Signed)
HIGH-RISK PREGNANCY VISIT Patient name: Joyce Ball MRN [id number]  Date of birth: Jul 26, 1995 Chief Complaint:   High Risk Gestation (Concerned about not gaining weight)  History of Present Illness:   Joyce Ball is a 24 y.o. G89P1001 female at [redacted]w[redacted]d with an Estimated Date of Delivery: 07/11/20 being seen today for ongoing management of a high-risk pregnancy complicated by diabetes mellitus A1DM.  Today she reports all sugars wnl.  Depression screen [facility] 2/9 04/16/2020 01/02/2020 12/05/2019  Decreased Interest 0 0 0  Down, Depressed, Hopeless 0 0 0  PHQ - 2 Score 0 0 0  Altered sleeping - 0 0  Tired, decreased energy - 0 1  Change in appetite - 0 0  Feeling bad or failure about yourself  - 0 0  Trouble concentrating - 0 0  Moving slowly or fidgety/restless - 0 0  Suicidal thoughts - 0 0  PHQ-9 Score - 0 1    Contractions: Not present. Vag. Bleeding: None.  Movement: Present. denies leaking of fluid.  Review of Systems:   Pertinent items are noted in HPI Denies abnormal vaginal discharge w/ itching/odor/irritation, headaches, visual changes, shortness of breath, chest pain, abdominal pain, severe nausea/vomiting, or problems with urination or bowel movements unless otherwise stated above. Pertinent History Reviewed:  Reviewed past medical,surgical, social, obstetrical and family history.  Reviewed problem list, medications and allergies. Physical Assessment:   Vitals:   05/06/20 1448  BP: 108/65  Pulse: 74  Weight: 202 lb (91.6 kg)  Body mass index is 36.95 kg/m.           Physical Examination:   General appearance: alert, well appearing, and in no distress  Mental status: alert, oriented to person, place, and time  Skin: warm & dry   Extremities: Edema: None    Cardiovascular: normal heart rate noted  Respiratory: normal respiratory effort, no distress  Abdomen: gravid, soft, non-tender, reducible umbilical hernia  Pelvic: Cervical exam deferred         Fetal  Status: Fetal Heart Rate (bpm): 135 Fundal Height: 30 cm Movement: Present    Fetal Surveillance Testing today: doppler   Chaperone: N/A    Results for orders placed or performed in visit on 05/06/20 (from the past 24 hour(s))  POC Urinalysis Dipstick OB   Collection Time: 05/06/20  2:49 PM  Result Value Ref Range   Color, UA     Clarity, UA     Glucose, UA Negative Negative   Bilirubin, UA     Ketones, UA neg    Spec Grav, UA     Blood, UA trace    pH, UA     POC,PROTEIN,UA Negative Negative, Trace, Small (1+), Moderate (2+), Large (3+), 4+   Urobilinogen, UA     Nitrite, UA neg    Leukocytes, UA Moderate (2+) (A) Negative   Appearance     Odor      Assessment & Plan:  High-risk pregnancy: G2P1001 at [redacted]w[redacted]d with an Estimated Date of Delivery: 07/11/20   1) A1DM, stable  2) Prev c/s, for RCS  3) H/O PPH  4) Reducible umbilical hernia  Meds: No orders of the defined types were placed in this encounter.   Labs/procedures today: none  Treatment Plan:  efw @ 36wks, RCS @ 39wks  Reviewed: Preterm labor symptoms and general obstetric precautions including but not limited to vaginal bleeding, contractions, leaking of fluid and fetal movement were reviewed in detail with the patient.  All questions were answered. Has home  bp cuff.  Check bp weekly, let us know if >140/90.   Follow-up: Return in about 2 weeks (around 05/20/2020) for HROB, CNM, in person.   Future Appointments  Date Time Provider Department Center  05/21/2020  3:00 PM Arabella Merles, CNM [facility]    Orders Placed This Encounter  Procedures  . POC Urinalysis Dipstick OB   Cheral Marker CNM, [facility] 05/06/2020 3:16 PM

## 2020-05-21 ENCOUNTER — Ambulatory Visit (INDEPENDENT_AMBULATORY_CARE_PROVIDER_SITE_OTHER): Payer: Medicaid Other | Admitting: Advanced Practice Midwife

## 2020-05-21 ENCOUNTER — Other Ambulatory Visit: Payer: Self-pay

## 2020-05-21 VITALS — BP 107/61 | HR 92 | Wt 195.8 lb

## 2020-05-21 DIAGNOSIS — Z1389 Encounter for screening for other disorder: Secondary | ICD-10-CM

## 2020-05-21 DIAGNOSIS — Z331 Pregnant state, incidental: Secondary | ICD-10-CM

## 2020-05-21 DIAGNOSIS — Z302 Encounter for sterilization: Secondary | ICD-10-CM | POA: Insufficient documentation

## 2020-05-21 DIAGNOSIS — Z3A32 32 weeks gestation of pregnancy: Secondary | ICD-10-CM

## 2020-05-21 DIAGNOSIS — O0990 Supervision of high risk pregnancy, unspecified, unspecified trimester: Secondary | ICD-10-CM

## 2020-05-21 LAB — POCT URINALYSIS DIPSTICK OB
Blood, UA: NEGATIVE
Glucose, UA: NEGATIVE
Ketones, UA: NEGATIVE
Nitrite, UA: NEGATIVE
POC,PROTEIN,UA: NEGATIVE

## 2020-05-21 NOTE — Progress Notes (Signed)
HIGH-RISK PREGNANCY VISIT Patient name: Joyce Ball MRN [id number]  Date of birth: 11/05/1995 Chief Complaint:   Routine Prenatal Visit  History of Present Illness:   Joyce Ball is a 25 y.o. G81P1001 female at [redacted]w[redacted]d with an Estimated Date of Delivery: 07/11/20 being seen today for ongoing management of a high-risk pregnancy complicated by diabetes mellitus A1DM.  Today she reports being concerned about overall 5lb weight loss during preg; doesn't desire any more children but isn't interested in LARCs- expresses a desire for BTL.  FBS: all <95      2hrPP: all but one <120 Depression screen [facility] 2/9 04/16/2020 01/02/2020 12/05/2019  Decreased Interest 0 0 0  Down, Depressed, Hopeless 0 0 0  PHQ - 2 Score 0 0 0  Altered sleeping - 0 0  Tired, decreased energy - 0 1  Change in appetite - 0 0  Feeling bad or failure about yourself  - 0 0  Trouble concentrating - 0 0  Moving slowly or fidgety/restless - 0 0  Suicidal thoughts - 0 0  PHQ-9 Score - 0 1    Contractions: Not present. Vag. Bleeding: None.  Movement: Present. denies leaking of fluid.  Review of Systems:   Pertinent items are noted in HPI Denies abnormal vaginal discharge w/ itching/odor/irritation, headaches, visual changes, shortness of breath, chest pain, abdominal pain, severe nausea/vomiting, or problems with urination or bowel movements unless otherwise stated above. Pertinent History Reviewed:  Reviewed past medical,surgical, social, obstetrical and family history.  Reviewed problem list, medications and allergies. Physical Assessment:   Vitals:   05/21/20 1410  BP: 107/61  Pulse: 92  Weight: 195 lb 12.8 oz (88.8 kg)  Body mass index is 35.81 kg/m.           Physical Examination:   General appearance: alert, well appearing, and in no distress  Mental status: alert, oriented to person, place, and time  Skin: warm & dry   Extremities: Edema: None    Cardiovascular: normal heart rate noted  Respiratory:  normal respiratory effort, no distress  Abdomen: gravid, soft, non-tender  Pelvic: Cervical exam deferred         Fetal Status: Fetal Heart Rate (bpm): 141 Fundal Height: 32 cm Movement: Present    Fetal Surveillance Testing today: doppler    Results for orders placed or performed in visit on 05/21/20 (from the past 24 hour(s))  POC Urinalysis Dipstick OB   Collection Time: 05/21/20  2:08 PM  Result Value Ref Range   Color, UA     Clarity, UA     Glucose, UA Negative Negative   Bilirubin, UA     Ketones, UA neg    Spec Grav, UA     Blood, UA neg    pH, UA     POC,PROTEIN,UA Negative Negative, Trace, Small (1+), Moderate (2+), Large (3+), 4+   Urobilinogen, UA     Nitrite, UA neg    Leukocytes, UA Moderate (2+) (A) Negative   Appearance     Odor      Assessment & Plan:  High-risk pregnancy: G2P1001 at [redacted]w[redacted]d with an Estimated Date of Delivery: 07/11/20   1) GDMA1, stable; plan for EFW at 36wks and rLTCS at 39wks  2) Prev C/S, for repeat at 39wks  3) Considering BTL, rev'd that LARCs have same efficacy however she isn't interested in them; to discuss with [facility] at next visit when scheduling rLTCS; sign 30d papers today  4) Overall 5lb weight loss in preg, reviewed  she is eating according to GDM standards and that may be the cause; reassured S=D and will get EFW at 36wks  Meds: No orders of the defined types were placed in this encounter.   Labs/procedures today: none  Treatment Plan:  [place] EFW at 36wks; IOL 39wks  Reviewed: Preterm labor symptoms and general obstetric precautions including but not limited to vaginal bleeding, contractions, leaking of fluid and fetal movement were reviewed in detail with the patient.  All questions were answered. Has home bp cuff. Check bp weekly, let us know if >140/90.   Follow-up: Return in about 2 weeks (around 06/04/2020) for HROB, in person with [facility], Sign BTL consent today.   Future Appointments  Date Time Provider Department Center   05/21/2020  3:00 PM Arabella Merles, CNM [facility]    Orders Placed This Encounter  Procedures  . POC Urinalysis Dipstick OB   Arabella Merles [facility] 05/21/2020 2:27 PM

## 2020-05-21 NOTE — Patient Instructions (Signed)
Joyce Ball, I greatly value your feedback.  If you receive a survey following your visit with [place] today, we appreciate you taking the time to fill it out.  Thanks, Philipp Deputy, CNM   [facility] ([address] [place], [place] [address]) Entrance C, located off of [address] valet parking  Go to [email or web address] to register for FREE online childbirth classes   Call the office [phone]) or go to [facility] if:  You begin to have strong, frequent contractions  Your water breaks.  Sometimes it is a big gush of fluid, sometimes it is just a trickle that keeps getting your panties wet or running down your legs  You have vaginal bleeding.  It is normal to have a small amount of spotting if your cervix was checked.   You don't feel your baby moving like normal.  If you don't, get you something to eat and drink and lay down and focus on feeling your baby move.  You should feel at least 10 movements in 2 hours.  If you don't, you should call the office or go to [facility].    Tdap Vaccine  It is recommended that you get the Tdap vaccine during the third trimester of EACH pregnancy to help protect your baby from getting pertussis (whooping cough)  27-36 weeks is the BEST time to do this so that you can pass the protection on to your baby. During pregnancy is better than after pregnancy, but if you are unable to get it during pregnancy it will be offered at the hospital.   You can get this vaccine with [place], at the health department, your family doctor, or some local pharmacies  Everyone who will be around your baby should also be up-to-date on their vaccines before the baby comes. Adults (who are not pregnant) only need 1 dose of Tdap during adulthood.   [place] Pediatricians/Family Doctors:  [facility] [phone]            [facility] [phone]                 [facility] [phone]  (usually not accepting new patients unless you have family there already, you are always welcome to call and ask)       [facility] Department [phone]       [facility] Pediatricians/Family Doctors:   [facility]: [phone]  [facility]: [phone]  [facility]: [phone]  [facility] Doctors:   [facility]: [phone]   [facility]: [phone]  [facility] Doctors:  [facility]: [phone]   Home Blood Pressure Monitoring for Patients   Your provider has recommended that you check your blood pressure (BP) at least once a week at home. If you do not have a blood pressure cuff at home, one will be provided for you. Contact your provider if you have not received your monitor within 1 week.   Helpful Tips for Accurate Home Blood Pressure Checks  . Don't smoke, exercise, or drink caffeine 30 minutes before checking your BP . Use the restroom before checking your BP (a full bladder can raise your pressure) . Relax in a comfortable upright chair . Feet on the ground . Left arm resting comfortably on a flat surface at the level of your heart . Legs uncrossed . Back supported . Sit quietly and don't talk . Place the cuff on your bare arm .  Adjust snuggly, so that only two fingertips can fit between your skin and the top of the cuff . Check 2 readings separated by at least one minute . Keep a log of your BP readings . For a visual, please reference this diagram: http://ccnc.care/bpdiagram  Provider Name: [facility]     Phone: [phone]  Zone 1: ALL CLEAR  Continue to monitor your symptoms:  . BP reading is less than 140 (top number) or less than 90 (bottom number)  . No right upper stomach pain . No headaches or seeing spots . No feeling nauseated or throwing up . No swelling in face and hands  Zone 2: CAUTION Call your doctor's office for  any of the following:  . BP reading is greater than 140 (top number) or greater than 90 (bottom number)  . Stomach pain under your ribs in the middle or right side . Headaches or seeing spots . Feeling nauseated or throwing up . Swelling in face and hands  Zone 3: EMERGENCY  Seek immediate medical care if you have any of the following:  . BP reading is greater than160 (top number) or greater than 110 (bottom number) . Severe headaches not improving with Tylenol . Serious difficulty catching your breath . Any worsening symptoms from Zone 2   Third Trimester of Pregnancy The third trimester is from week 29 through week 42, months 7 through 9. The third trimester is a time when the fetus is growing rapidly. At the end of the ninth month, the fetus is about 20 inches in length and weighs 6-10 pounds.  BODY CHANGES Your body goes through many changes during pregnancy. The changes vary from woman to woman.   Your weight will continue to increase. You can expect to gain 25-35 pounds (11-16 kg) by the end of the pregnancy.  You may begin to get stretch marks on your hips, abdomen, and breasts.  You may urinate more often because the fetus is moving lower into your pelvis and pressing on your bladder.  You may develop or continue to have heartburn as a result of your pregnancy.  You may develop constipation because certain hormones are causing the muscles that push waste through your intestines to slow down.  You may develop hemorrhoids or swollen, bulging veins (varicose veins).  You may have pelvic pain because of the weight gain and pregnancy hormones relaxing your joints between the bones in your pelvis. Backaches may result from overexertion of the muscles supporting your posture.  You may have changes in your hair. These can include thickening of your hair, rapid growth, and changes in texture. Some women also have hair loss during or after pregnancy, or hair that feels dry or thin.  Your hair will most likely return to normal after your baby is born.  Your breasts will continue to grow and be tender. A yellow discharge may leak from your breasts called colostrum.  Your belly button may stick out.  You may feel short of breath because of your expanding uterus.  You may notice the fetus "dropping," or moving lower in your abdomen.  You may have a bloody mucus discharge. This usually occurs a few days to a week before labor begins.  Your cervix becomes thin and soft (effaced) near your due date. WHAT TO EXPECT AT YOUR PRENATAL EXAMS  You will have prenatal exams every 2 weeks until week 36. Then, you will have weekly prenatal exams. During a routine prenatal visit:  You will be  weighed to make sure you and the fetus are growing normally.  Your blood pressure is taken.  Your abdomen will be measured to track your baby's growth.  The fetal heartbeat will be listened to.  Any test results from the previous visit will be discussed.  You may have a cervical check near your due date to see if you have effaced. At around 36 weeks, your caregiver will check your cervix. At the same time, your caregiver will also perform a test on the secretions of the vaginal tissue. This test is to determine if a type of bacteria, Group B streptococcus, is present. Your caregiver will explain this further. Your caregiver may ask you:  What your birth plan is.  How you are feeling.  If you are feeling the baby move.  If you have had any abnormal symptoms, such as leaking fluid, bleeding, severe headaches, or abdominal cramping.  If you have any questions. Other tests or screenings that may be performed during your third trimester include:  Blood tests that check for low iron levels (anemia).  Fetal testing to check the health, activity level, and growth of the fetus. Testing is done if you have certain medical conditions or if there are problems during the pregnancy. FALSE  LABOR You may feel small, irregular contractions that eventually go away. These are called Braxton Hicks contractions, or false labor. Contractions may last for hours, days, or even weeks before true labor sets in. If contractions come at regular intervals, intensify, or become painful, it is best to be seen by your caregiver.  SIGNS OF LABOR   Menstrual-like cramps.  Contractions that are 5 minutes apart or less.  Contractions that start on the top of the uterus and spread down to the lower abdomen and back.  A sense of increased pelvic pressure or back pain.  A watery or bloody mucus discharge that comes from the vagina. If you have any of these signs before the 37th week of pregnancy, call your caregiver right away. You need to go to the hospital to get checked immediately. HOME CARE INSTRUCTIONS   Avoid all smoking, herbs, alcohol, and unprescribed drugs. These chemicals affect the formation and growth of the baby.  Follow your caregiver's instructions regarding medicine use. There are medicines that are either safe or unsafe to take during pregnancy.  Exercise only as directed by your caregiver. Experiencing uterine cramps is a good sign to stop exercising.  Continue to eat regular, healthy meals.  Wear a good support bra for breast tenderness.  Do not use hot tubs, steam rooms, or saunas.  Wear your seat belt at all times when driving.  Avoid raw meat, uncooked cheese, cat litter boxes, and soil used by cats. These carry germs that can cause birth defects in the baby.  Take your prenatal vitamins.  Try taking a stool softener (if your caregiver approves) if you develop constipation. Eat more high-fiber foods, such as fresh vegetables or fruit and whole grains. Drink plenty of fluids to keep your urine clear or pale yellow.  Take warm sitz baths to soothe any pain or discomfort caused by hemorrhoids. Use hemorrhoid cream if your caregiver approves.  If you develop varicose  veins, wear support hose. Elevate your feet for 15 minutes, 3-4 times a day. Limit salt in your diet.  Avoid heavy lifting, wear low heal shoes, and practice good posture.  Rest a lot with your legs elevated if you have leg cramps or low back pain.  Visit your dentist if you have not gone during your pregnancy. Use a soft toothbrush to brush your teeth and be gentle when you floss.  A sexual relationship may be continued unless your caregiver directs you otherwise.  Do not travel far distances unless it is absolutely necessary and only with the approval of your caregiver.  Take prenatal classes to understand, practice, and ask questions about the labor and delivery.  Make a trial run to the hospital.  Pack your hospital bag.  Prepare the baby's nursery.  Continue to go to all your prenatal visits as directed by your caregiver. SEEK MEDICAL CARE IF:  You are unsure if you are in labor or if your water has broken.  You have dizziness.  You have mild pelvic cramps, pelvic pressure, or nagging pain in your abdominal area.  You have persistent nausea, vomiting, or diarrhea.  You have a bad smelling vaginal discharge.  You have pain with urination. SEEK IMMEDIATE MEDICAL CARE IF:   You have a fever.  You are leaking fluid from your vagina.  You have spotting or bleeding from your vagina.  You have severe abdominal cramping or pain.  You have rapid weight loss or gain.  You have shortness of breath with chest pain.  You notice sudden or extreme swelling of your face, hands, ankles, feet, or legs.  You have not felt your baby move in over an hour.  You have severe headaches that do not go away with medicine.  You have vision changes. Document Released: 04/27/2001 Document Revised: 05/08/2013 Document Reviewed: 07/04/2012 [facility] Patient Information 2015 [place], [place]. This information is not intended to replace advice given to you by your health care provider. Make  sure you discuss any questions you have with your health care provider.

## 2020-06-03 ENCOUNTER — Other Ambulatory Visit: Payer: Self-pay

## 2020-06-03 ENCOUNTER — Ambulatory Visit (INDEPENDENT_AMBULATORY_CARE_PROVIDER_SITE_OTHER): Payer: Medicaid Other | Admitting: Family Medicine

## 2020-06-03 VITALS — BP 112/62 | HR 91 | Wt 198.6 lb

## 2020-06-03 DIAGNOSIS — Z7189 Other specified counseling: Secondary | ICD-10-CM

## 2020-06-03 DIAGNOSIS — Z1389 Encounter for screening for other disorder: Secondary | ICD-10-CM

## 2020-06-03 DIAGNOSIS — Z98891 History of uterine scar from previous surgery: Secondary | ICD-10-CM

## 2020-06-03 DIAGNOSIS — O0990 Supervision of high risk pregnancy, unspecified, unspecified trimester: Secondary | ICD-10-CM

## 2020-06-03 DIAGNOSIS — O0993 Supervision of high risk pregnancy, unspecified, third trimester: Secondary | ICD-10-CM

## 2020-06-03 DIAGNOSIS — O34219 Maternal care for unspecified type scar from previous cesarean delivery: Secondary | ICD-10-CM

## 2020-06-03 DIAGNOSIS — Z3A34 34 weeks gestation of pregnancy: Secondary | ICD-10-CM

## 2020-06-03 DIAGNOSIS — O24410 Gestational diabetes mellitus in pregnancy, diet controlled: Secondary | ICD-10-CM

## 2020-06-03 DIAGNOSIS — Z8759 Personal history of other complications of pregnancy, childbirth and the puerperium: Secondary | ICD-10-CM

## 2020-06-03 LAB — POCT URINALYSIS DIPSTICK OB
Blood, UA: NEGATIVE
Glucose, UA: NEGATIVE
Leukocytes, UA: NEGATIVE
Nitrite, UA: NEGATIVE
POC,PROTEIN,UA: NEGATIVE

## 2020-06-03 NOTE — Progress Notes (Unsigned)
PRENATAL VISIT NOTE  Subjective:  Joyce Ball is a 25 y.o. G2P1001 at [redacted]w[redacted]d being seen today for ongoing prenatal care.  She is currently monitored for the following issues for this high-risk pregnancy and has History of postpartum hemorrhage; History of cesarean section; Supervision of high risk pregnancy, antepartum; Asymptomatic bacteriuria during pregnancy in first trimester; Gestational diabetes mellitus, class A1; Umbilical hernia; and Request for sterilization on their problem list.  Patient reports no complaints.  Contractions: Not present. Vag. Bleeding: None.  Movement: Present. Denies leaking of fluid.   The following portions of the patient's history were reviewed and updated as appropriate: allergies, current medications, past family history, past medical history, past social history, past surgical history and problem list.   Objective:   Vitals:   06/03/20 1338  BP: 112/62  Pulse: 91  Weight: 198 lb 9.6 oz (90.1 kg)    Fetal Status: Fetal Heart Rate (bpm): 127 Fundal Height: 35 cm Movement: Present     General:  Alert, oriented and cooperative. Patient is in no acute distress.  Skin: Skin is warm and dry. No rash noted.   Cardiovascular: Normal heart rate noted  Respiratory: Normal respiratory effort, no problems with respiration noted  Abdomen: Soft, gravid, appropriate for gestational age.  Pain/Pressure: Absent     Pelvic: Cervical exam deferred        Extremities: Normal range of motion.  Edema: None  Mental Status: Normal mood and affect. Normal behavior. Normal judgment and thought content.   Assessment and Plan:  Pregnancy: G2P1001 at [redacted]w[redacted]d 1. [redacted] weeks gestation of pregnancy - POC Urinalysis Dipstick OB  2. Screening for genitourinary condition - POC Urinalysis Dipstick OB  3. Supervision of high risk pregnancy, antepartum Up to date BTL signed last visit. Questions today about BTL. We discussed LARC options today. She is thinking about IUD at  time of CS  COVID-19 Vaccine Counseling: The patient was counseled on the potential benefits and lack of known risks of COVID vaccination, during pregnancy and breastfeeding, during today's visit. The patient's questions and concerns were addressed today, including safety of the vaccination and potential side effects as they have been published by ACOG and SMFM. The patient has been informed that there have not been any documented vaccine related injuries, deaths or birth defects to infant or mom after receiving the COVID-19 vaccine to date. The patient has been made aware that although she is not at increased risk of contracting COVID-19 during pregnancy, she is at increased risk of developing severe disease and complications if she contracts COVID-19 while pregnant. All patient questions were addressed during our visit today. The patient is still unsure of her decision for vaccination.   4. History of postpartum hemorrhage LD aware, required cytotec, methergine and hemabate. Consider TXA at CS  5. History of cesarean section Desires elective RCS at 39 week  6. Gestational diabetes mellitus, class A1 Fastings 80-90s 2hr PP 110-120, nothing over 120 per patient report Well controlled A1GDM Will need [place] at 36 week for fetal growth- forgot to order at visit and wrote Larna Daughters 06/04/20 to ask about scheduling on 2/1   Preterm labor symptoms and general obstetric precautions including but not limited to vaginal bleeding, contractions, leaking of fluid and fetal movement were reviewed in detail with the patient. Please refer to After Visit Summary for other counseling recommendations.   Return in about 2 weeks (around 06/17/2020) for Routine prenatal care, MD or APP, 36wks.  Future Appointments  Date Time Provider Department Center  06/17/2020 10:10 AM Lazaro Arms, MD [facility]    Federico Flake, MD

## 2020-06-05 ENCOUNTER — Ambulatory Visit: Payer: Self-pay | Admitting: *Deleted

## 2020-06-05 DIAGNOSIS — I959 Hypotension, unspecified: Secondary | ICD-10-CM | POA: Diagnosis not present

## 2020-06-05 DIAGNOSIS — R000 Tachycardia, unspecified: Secondary | ICD-10-CM | POA: Diagnosis not present

## 2020-06-05 DIAGNOSIS — O98513 Other viral diseases complicating pregnancy, third trimester: Secondary | ICD-10-CM | POA: Diagnosis not present

## 2020-06-05 DIAGNOSIS — R5381 Other malaise: Secondary | ICD-10-CM | POA: Diagnosis not present

## 2020-06-05 DIAGNOSIS — Z3A35 35 weeks gestation of pregnancy: Secondary | ICD-10-CM | POA: Diagnosis not present

## 2020-06-05 DIAGNOSIS — E860 Dehydration: Secondary | ICD-10-CM | POA: Diagnosis not present

## 2020-06-05 DIAGNOSIS — U071 COVID-19: Secondary | ICD-10-CM | POA: Diagnosis not present

## 2020-06-05 DIAGNOSIS — O99283 Endocrine, nutritional and metabolic diseases complicating pregnancy, third trimester: Secondary | ICD-10-CM | POA: Diagnosis not present

## 2020-06-05 NOTE — Telephone Encounter (Signed)
C/o fever 101.4, throbbing headache, body aches and [redacted] weeks pregnant. Patient reports symptoms started today and she was just tested at [facility] for covid. Patient has taken tylenol ES. Encouraged patient to take regular dose of tylenol for fever. Instructed patient to call PCP or OBGYN. Patient reports OBGYN closed. Instructed patient to go online and do telemed visit due to symptoms and due to [redacted] weeks pregnant. Instructed patient to go to ED if symptoms worsen. Reviewed isolation precautions for covid and patient should remain on isolation and continue to find out covid test results. Care advise given. Reviewed need to maintain hydration. Patient verbalized understanding of care advise and to call back or go to ED for assessment and if symptoms worsen.   Reason for Disposition . HIGH RISK for severe COVID complications (e.g., age > 64 years, obesity with BMI > 25, pregnant, chronic lung disease or other chronic medical condition)  (Exception: Already seen by PCP and no new or worsening symptoms.)  Answer Assessment - Initial Assessment Questions 1. TEMPERATURE: "What is the most recent temperature?"  "How was it measured?"      *No Answer* 2. ONSET: "When did the fever start?"      *No Answer* 3. SYMPTOMS: "Do you have any other symptoms besides the fever?"  (e.g., colds, headache, sore throat, earache, cough, rash, diarrhea, vomiting, abdominal pain)     *No Answer* 4. CAUSE: If there are no symptoms, ask: "What do you think is causing the fever?"      *No Answer* 5. CONTACTS: "Does anyone else in the family have an infection?"     *No Answer* 6. TREATMENT: "What have you done so far to treat this fever?" (e.g., medications)     *No Answer* 7. IMMUNOCOMPROMISE: "Do you have of the following: diabetes, HIV positive, splenectomy, cancer chemotherapy, chronic steroid treatment, transplant patient, etc."     *No Answer* 8. PREGNANCY: "Is there any chance you are pregnant?" "When was your last  menstrual period?"     *No Answer* 9. TRAVEL: "Have you traveled out of the country in the last month?" (e.g., travel history, exposures)     *No Answer*  Answer Assessment - Initial Assessment Questions 1. COVID-19 DIAGNOSIS: "Who made your COVID-19 diagnosis?" "Was it confirmed by a positive lab test?" If not diagnosed by a HCP, ask "Are there lots of cases (community spread) where you live?" Note: See public health department website, if unsure.     Tested today at [facility]  2. COVID-19 EXPOSURE: "Was there any known exposure to COVID before the symptoms began?" CDC Definition of close contact: within 6 feet (2 meters) for a total of 15 minutes or more over a 24-hour period.      Na  3. ONSET: "When did the COVID-19 symptoms start?"      Today  4. WORST SYMPTOM: "What is your worst symptom?" (e.g., cough, fever, shortness of breath, muscle aches)     Fever, body aches 5. COUGH: "Do you have a cough?" If Yes, ask: "How bad is the cough?"       na 6. FEVER: "Do you have a fever?" If Yes, ask: "What is your temperature, how was it measured, and when did it start?"     101.4 7. RESPIRATORY STATUS: "Describe your breathing?" (e.g., shortness of breath, wheezing, unable to speak)      na 8. BETTER-SAME-WORSE: "Are you getting better, staying the same or getting worse compared to yesterday?"  If getting worse, ask, "In what  way?"     Worse. 9. HIGH RISK DISEASE: "Do you have any chronic medical problems?" (e.g., asthma, heart or lung disease, weak immune system, obesity, etc.)     Pregnant  10. VACCINE: "Have you gotten the COVID-19 vaccine?" If Yes ask: "Which one, how many shots, when did you get it?"       na 11. PREGNANCY: "Is there any chance you are pregnant?" "When was your last menstrual period?"     [redacted] weeks pregnant 12. OTHER SYMPTOMS: "Do you have any other symptoms?"  (e.g., chills, fatigue, headache, loss of smell or taste, muscle pain, sore throat; new loss of smell or taste  especially support the diagnosis of COVID-19)       Fatigue, headache, body aches, fever.  Protocols used: CORONAVIRUS (COVID-19) DIAGNOSED OR SUSPECTED-A-AH, FEVER-A-AH

## 2020-06-09 ENCOUNTER — Telehealth: Payer: Self-pay

## 2020-06-09 NOTE — Telephone Encounter (Signed)
Transition Care Management Unsuccessful Follow-up Telephone Call  Date of discharge and from where:  06/05/2020 [facility] ED  Attempts:  1st Attempt  Reason for unsuccessful TCM follow-up call:  Unable to reach patient, message: The wireless customer you are calling is unavailable.

## 2020-06-10 NOTE — Telephone Encounter (Signed)
Transition Care Management Follow-up Telephone Call  Date of discharge and from where: 06/05/2020 [facility] ED  How have you been since you were released from the hospital? Patient stated she is feeling a lot better.   Any questions or concerns? No  Items Reviewed:  Did the pt receive and understand the discharge instructions provided? Yes   Medications obtained and verified? Yes   Other? No   Any new allergies since your discharge? No   Dietary orders reviewed? Yes  Do you have support at home? Yes   Functional Questionnaire: (I = Independent and D = Dependent) ADLs: I  Bathing/Dressing- I  Meal Prep- I  Eating- I  Maintaining continence- I  Transferring/Ambulation- I  Managing Meds- I  Follow up appointments reviewed:   PCP Hospital f/u appt confirmed? No  Patient is currently pregnant stated she will find one after having her baby.   Specialist Hospital f/u appt confirmed? Yes  Scheduled to see [facility] on 06/17/2020 @ 10:10am.  Are transportation arrangements needed? No   If their condition worsens, is the pt aware to call PCP or go to the Emergency Dept.? Yes  Was the patient provided with contact information for the PCP's office or ED? Yes  Was to pt encouraged to call back with questions or concerns? Yes

## 2020-06-17 ENCOUNTER — Encounter: Payer: Self-pay | Admitting: Obstetrics & Gynecology

## 2020-06-17 ENCOUNTER — Ambulatory Visit (INDEPENDENT_AMBULATORY_CARE_PROVIDER_SITE_OTHER): Payer: Medicaid Other | Admitting: Obstetrics & Gynecology

## 2020-06-17 ENCOUNTER — Other Ambulatory Visit: Payer: Self-pay

## 2020-06-17 ENCOUNTER — Other Ambulatory Visit (HOSPITAL_COMMUNITY)
Admission: RE | Admit: 2020-06-17 | Discharge: 2020-06-17 | Disposition: A | Discharge status: Home / Self Care | Payer: Medicaid Other | Source: Ambulatory Visit | Attending: Obstetrics & Gynecology | Admitting: Obstetrics & Gynecology

## 2020-06-17 ENCOUNTER — Ambulatory Visit (INDEPENDENT_AMBULATORY_CARE_PROVIDER_SITE_OTHER): Payer: Medicaid Other

## 2020-06-17 VITALS — BP 115/67 | HR 85 | Wt 196.0 lb

## 2020-06-17 DIAGNOSIS — Z302 Encounter for sterilization: Secondary | ICD-10-CM

## 2020-06-17 DIAGNOSIS — O09893 Supervision of other high risk pregnancies, third trimester: Secondary | ICD-10-CM | POA: Diagnosis not present

## 2020-06-17 DIAGNOSIS — O0990 Supervision of high risk pregnancy, unspecified, unspecified trimester: Secondary | ICD-10-CM | POA: Diagnosis not present

## 2020-06-17 DIAGNOSIS — R8271 Bacteriuria: Secondary | ICD-10-CM

## 2020-06-17 DIAGNOSIS — Z3A36 36 weeks gestation of pregnancy: Secondary | ICD-10-CM | POA: Insufficient documentation

## 2020-06-17 DIAGNOSIS — O24410 Gestational diabetes mellitus in pregnancy, diet controlled: Secondary | ICD-10-CM

## 2020-06-17 DIAGNOSIS — Z8759 Personal history of other complications of pregnancy, childbirth and the puerperium: Secondary | ICD-10-CM

## 2020-06-17 NOTE — Progress Notes (Signed)
[place] 36+4 wks,cephalic,posterior placenta gr 2,afi 16 cm,fhr 148 bpm,EFW 3325 g 84%

## 2020-06-17 NOTE — Progress Notes (Signed)
   HIGH-RISK PREGNANCY VISIT Patient name: Joyce Ball MRN [id number]  Date of birth: 06-01-1995 Chief Complaint:   Routine Prenatal Visit  History of Present Illness:   Joyce Ball is a 25 y.o. G72P1001 female at [redacted]w[redacted]d with an Estimated Date of Delivery: 07/11/20 being seen today for ongoing management of a high-risk pregnancy complicated by Class A1 DM.  Today she reports CBG are in acceptable range both fasting and 2 hours pp.  Depression screen [facility] 2/9 04/16/2020 01/02/2020 12/05/2019  Decreased Interest 0 0 0  Down, Depressed, Hopeless 0 0 0  PHQ - 2 Score 0 0 0  Altered sleeping - 0 0  Tired, decreased energy - 0 1  Change in appetite - 0 0  Feeling bad or failure about yourself  - 0 0  Trouble concentrating - 0 0  Moving slowly or fidgety/restless - 0 0  Suicidal thoughts - 0 0  PHQ-9 Score - 0 1    Contractions: Not present. Vag. Bleeding: None.  Movement: Present. denies leaking of fluid.  Review of Systems:   Pertinent items are noted in HPI Denies abnormal vaginal discharge w/ itching/odor/irritation, headaches, visual changes, shortness of breath, chest pain, abdominal pain, severe nausea/vomiting, or problems with urination or bowel movements unless otherwise stated above. Pertinent History Reviewed:  Reviewed past medical,surgical, social, obstetrical and family history.  Reviewed problem list, medications and allergies. Physical Assessment:   Vitals:   06/17/20 1002  BP: 115/67  Pulse: 85  Weight: 196 lb (88.9 kg)  Body mass index is 35.85 kg/m.           Physical Examination:   General appearance: alert, well appearing, and in no distress  Mental status: alert, oriented to person, place, and time  Skin: warm & dry   Extremities: Edema: None    Cardiovascular: normal heart rate noted  Respiratory: normal respiratory effort, no distress  Abdomen: gravid, soft, non-tender  Pelvic: Cervical exam performed        2/50/-2/soft/midplane  Fetal Status:      Movement: Present    Fetal Surveillance Testing today: sonogram   Chaperone: Faith Rogue  No results found for this or any previous visit (from the past 24 hour(s)).  Assessment & Plan:  High-risk pregnancy: G2P1001 at [redacted]w[redacted]d with an Estimated Date of Delivery: 07/11/20   1) Class A1 DM, EFW 83%,   2) Previous C section, pt states Dr Alvester Morin is planning to do her repeat C section  Meds: No orders of the defined types were placed in this encounter.   Labs/procedures today: cultures  Treatment Plan:  Routine care  Reviewed: Term labor symptoms and general obstetric precautions including but not limited to vaginal bleeding, contractions, leaking of fluid and fetal movement were reviewed in detail with the patient.  All questions were answered.  home bp cuff. Rx faxed to . Check bp weekly, let us know if >140/90.   Follow-up: Return in about 10 days (around 06/27/2020) for HROB.   No future appointments.  Orders Placed This Encounter  Procedures  . Strep Gp B NAA+Rflx   Lazaro Arms 06/17/2020 10:43 AM

## 2020-06-18 LAB — CERVICOVAGINAL ANCILLARY ONLY
Chlamydia: NEGATIVE
Comment: NEGATIVE
Comment: NORMAL
Neisseria Gonorrhea: NEGATIVE

## 2020-06-19 LAB — STREP GP B NAA+RFLX: Strep Gp B NAA+Rflx: NEGATIVE

## 2020-06-24 ENCOUNTER — Telehealth: Payer: Self-pay | Admitting: Obstetrics & Gynecology

## 2020-06-24 ENCOUNTER — Telehealth: Payer: Self-pay | Admitting: *Deleted

## 2020-06-24 NOTE — Telephone Encounter (Signed)
Patient states she has been having irregular contractions since early this morning and is unsure if they are real or false labor contractions. Describes some as stronger than others.   Denies bleeding or leaking. Informed patient it sounded more like false labor contractions but to continue to monitor.  If she noticed any bleeding, leaking or contractions that were increasing in intensity, to let us know or [facility]. Pt verbalized understanding and agreeable to do so.

## 2020-06-24 NOTE — Patient Instructions (Signed)
[place]  06/24/2020   Your procedure is scheduled on:  07/05/2020  Arrive at 0530 at [employer] [facility] on [address] at [facility]  and [address]. You are invited to use the FREE valet parking or use the Visitor's parking deck.  Pick up the phone at the desk and dial [phone].  Call this number if you have problems the morning of surgery: [phone]  Remember:   Do not eat food:(After Midnight) Desps de medianoche.  Do not drink clear liquids: (After Midnight) Desps de medianoche.  Take these medicines the morning of surgery with A SIP OF WATER:  none   Do not wear jewelry, make-up or nail polish.  Do not wear lotions, powders, or perfumes. Do not wear deodorant.  Do not shave 48 hours prior to surgery.  Do not bring valuables to the hospital.  [facility] is not   responsible for any belongings or valuables brought to the hospital.  Contacts, dentures or bridgework may not be worn into surgery.  Leave suitcase in the car. After surgery it may be brought to your room.  For patients admitted to the hospital, checkout time is 11:00 AM the day of              discharge.      Please read over the following fact sheets that you were given:     Preparing for Surgery

## 2020-06-24 NOTE — Telephone Encounter (Signed)
Patient stated that she has been experiencing some abdominal pain since 3am, (don't know if it's contractions) and wants to speak with a nurse. Call was tranferred to Tish Banker).

## 2020-06-26 ENCOUNTER — Encounter (HOSPITAL_COMMUNITY): Payer: Self-pay

## 2020-06-27 ENCOUNTER — Other Ambulatory Visit: Payer: Self-pay

## 2020-06-27 ENCOUNTER — Encounter: Payer: Self-pay | Admitting: Obstetrics & Gynecology

## 2020-06-27 ENCOUNTER — Ambulatory Visit (INDEPENDENT_AMBULATORY_CARE_PROVIDER_SITE_OTHER): Payer: Medicaid Other | Admitting: Obstetrics & Gynecology

## 2020-06-27 ENCOUNTER — Other Ambulatory Visit: Payer: Self-pay | Admitting: Family Medicine

## 2020-06-27 VITALS — BP 114/73 | HR 73 | Wt 195.0 lb

## 2020-06-27 DIAGNOSIS — Z1389 Encounter for screening for other disorder: Secondary | ICD-10-CM

## 2020-06-27 DIAGNOSIS — O0993 Supervision of high risk pregnancy, unspecified, third trimester: Secondary | ICD-10-CM

## 2020-06-27 DIAGNOSIS — O24410 Gestational diabetes mellitus in pregnancy, diet controlled: Secondary | ICD-10-CM

## 2020-06-27 DIAGNOSIS — Z3A38 38 weeks gestation of pregnancy: Secondary | ICD-10-CM

## 2020-06-27 LAB — POCT URINALYSIS DIPSTICK OB
Blood, UA: NEGATIVE
Glucose, UA: NEGATIVE
Ketones, UA: NEGATIVE
Leukocytes, UA: NEGATIVE
Nitrite, UA: NEGATIVE
POC,PROTEIN,UA: NEGATIVE

## 2020-06-27 MED ORDER — NYSTATIN 100000 UNIT/GM EX CREA: 1.0000 "application " | TOPICAL_CREAM | Freq: Two times a day (BID) | CUTANEOUS | 11 refills | Status: DC

## 2020-06-27 NOTE — Progress Notes (Signed)
HIGH-RISK PREGNANCY VISIT Patient name: Joyce Ball MRN [id number]  Date of birth: December 02, 1995 Chief Complaint:   Routine Prenatal Visit and High Risk Gestation  History of Present Illness:   Joyce Ball is a 25 y.o. G66P1001 female at [redacted]w[redacted]d with an Estimated Date of Delivery: 07/11/20 being seen today for ongoing management of a high-risk pregnancy complicated by diabetes mellitus A1DM.  Today she reports no complaints.  Depression screen [facility] 2/9 04/16/2020 01/02/2020 12/05/2019  Decreased Interest 0 0 0  Down, Depressed, Hopeless 0 0 0  PHQ - 2 Score 0 0 0  Altered sleeping - 0 0  Tired, decreased energy - 0 1  Change in appetite - 0 0  Feeling bad or failure about yourself  - 0 0  Trouble concentrating - 0 0  Moving slowly or fidgety/restless - 0 0  Suicidal thoughts - 0 0  PHQ-9 Score - 0 1    Contractions: Irregular. Vag. Bleeding: None.  Movement: Present. denies leaking of fluid.  Review of Systems:   Pertinent items are noted in HPI Denies abnormal vaginal discharge w/ itching/odor/irritation, headaches, visual changes, shortness of breath, chest pain, abdominal pain, severe nausea/vomiting, or problems with urination or bowel movements unless otherwise stated above. Pertinent History Reviewed:  Reviewed past medical,surgical, social, obstetrical and family history.  Reviewed problem list, medications and allergies. Physical Assessment:   Vitals:   06/27/20 1153  BP: 114/73  Pulse: 73  Weight: 195 lb (88.5 kg)  Body mass index is 33.47 kg/m.           Physical Examination:   General appearance: alert, well appearing, and in no distress  Mental status: alert, oriented to person, place, and time  Skin: warm & dry   Extremities: Edema: None    Cardiovascular: normal heart rate noted  Respiratory: normal respiratory effort, no distress  Abdomen: gravid, soft, non-tender  Pelvic: Cervical exam performed  Dilation: 2 Effacement (%): 50 Station: -2  Fetal  Status: Fetal Heart Rate (bpm): 135 Fundal Height: 39 cm Movement: Present Presentation: Vertex  Fetal Surveillance Testing today:    Chaperone: N/A    Results for orders placed or performed in visit on 06/27/20 (from the past 24 hour(s))  POC Urinalysis Dipstick OB   Collection Time: 06/27/20 11:48 AM  Result Value Ref Range   Color, UA     Clarity, UA     Glucose, UA Negative Negative   Bilirubin, UA     Ketones, UA neg    Spec Grav, UA     Blood, UA neg    pH, UA     POC,PROTEIN,UA Negative Negative, Trace, Small (1+), Moderate (2+), Large (3+), 4+   Urobilinogen, UA     Nitrite, UA neg    Leukocytes, UA Negative Negative   Appearance     Odor      Assessment & Plan:  High-risk pregnancy: G2P1001 at [redacted]w[redacted]d with an Estimated Date of Delivery: 07/11/20   1) A1DM, stable  2) Yeast vulvitis/moisture changes, treated with GV today, nystatin cream given as well  Meds:  Meds ordered this encounter  Medications  . nystatin cream (MYCOSTATIN)    Sig: Apply 1 application topically 2 (two) times daily.    Dispense:  30 g    Refill:  11    Labs/procedures today: SVE  Treatment Plan:  Planned repeat c section next week with Dr Alvester Morin  Reviewed: Term labor symptoms and general obstetric precautions including but not limited to vaginal bleeding,  contractions, leaking of fluid and fetal movement were reviewed in detail with the patient.  All questions were answered. Has home bp cuff. Rx faxed to . Check bp weekly, let us know if >140/90.   Follow-up: No follow-ups on file.   Future Appointments  Date Time Provider Department Center  07/03/2020  9:00 AM [facility] PAT 1 [facility] None    Orders Placed This Encounter  Procedures  . POC Urinalysis Dipstick OB   Lazaro Arms  06/27/2020 12:20 PM

## 2020-07-03 ENCOUNTER — Encounter (HOSPITAL_COMMUNITY)
Admission: RE | Admit: 2020-07-03 | Discharge: 2020-07-03 | Disposition: A | Discharge status: Home / Self Care | Payer: Medicaid Other | Source: Ambulatory Visit | Attending: Family Medicine | Admitting: Family Medicine

## 2020-07-03 ENCOUNTER — Other Ambulatory Visit: Payer: Self-pay

## 2020-07-03 DIAGNOSIS — Z01812 Encounter for preprocedural laboratory examination: Secondary | ICD-10-CM | POA: Diagnosis not present

## 2020-07-03 HISTORY — DX: Personal history of other complications of pregnancy, childbirth and the puerperium: Z87.59

## 2020-07-03 LAB — CBC
HCT: 33.1 % — ABNORMAL LOW (ref 36.0–46.0)
Hemoglobin: 11.1 g/dL — ABNORMAL LOW (ref 12.0–15.0)
MCH: 28.5 pg (ref 26.0–34.0)
MCHC: 33.5 g/dL (ref 30.0–36.0)
MCV: 84.9 fL (ref 80.0–100.0)
Platelets: 239 10*3/uL (ref 150–400)
RBC: 3.9 MIL/uL (ref 3.87–5.11)
RDW: 14.8 % (ref 11.5–15.5)
WBC: 7.8 10*3/uL (ref 4.0–10.5)
nRBC: 0 % (ref 0.0–0.2)

## 2020-07-03 LAB — TYPE AND SCREEN
ABO/RH(D): A POS
Antibody Screen: NEGATIVE

## 2020-07-03 LAB — RAPID HIV SCREEN (HIV 1/2 AB+AG)
HIV 1/2 Antibodies: NONREACTIVE
HIV-1 P24 Antigen - HIV24: NONREACTIVE

## 2020-07-03 NOTE — Progress Notes (Signed)
Pt not tested for covid today(07/03/20) due to pt testing + for covid on 06/05/20(results in Care Everywhere). Based on the guidelines the pt is in the 90 day window to not retest. The pt is still expected to quarantine until their procedure. Therefore, the pt can still have the scheduled procedure.

## 2020-07-04 LAB — RPR: RPR Ser Ql: NONREACTIVE

## 2020-07-04 NOTE — Anesthesia Preprocedure Evaluation (Addendum)
Anesthesia Evaluation  Patient identified by MRN, date of birth, ID band Patient awake    Reviewed: Allergy & Precautions, NPO status , Patient's Chart, lab work & pertinent test results  Airway Mallampati: II  TM Distance: >3 FB Neck ROM: Full    Dental  (+) Teeth Intact, Dental Advisory Given   Pulmonary neg pulmonary ROS, former smoker,    Pulmonary exam normal breath sounds clear to auscultation       Cardiovascular negative cardio ROS Normal cardiovascular exam Rhythm:Regular Rate:Normal     Neuro/Psych negative neurological ROS  negative psych ROS   GI/Hepatic negative GI ROS, Neg liver ROS,   Endo/Other  diabetes, GestationalObesity BMI 34  Renal/GU negative Renal ROS  negative genitourinary   Musculoskeletal negative musculoskeletal ROS (+)   Abdominal   Peds negative pediatric ROS (+)  Hematology  (+) Blood dyscrasia, anemia , hct 33.1, plt 239   Anesthesia Other Findings   Reproductive/Obstetrics (+) Pregnancy 1 prior section 2017 w/ PPH                            Anesthesia Physical Anesthesia Plan  ASA: II  Anesthesia Plan: Spinal   Post-op Pain Management:    Induction:   PONV Risk Score and Plan: 3 and Ondansetron, Dexamethasone and Treatment may vary due to age or medical condition  Airway Management Planned: Natural Airway  Additional Equipment: None  Intra-op Plan:   Post-operative Plan:   Informed Consent: I have reviewed the patients History and Physical, chart, labs and discussed the procedure including the risks, benefits and alternatives for the proposed anesthesia with the patient or authorized representative who has indicated his/her understanding and acceptance.       Plan Discussed with: CRNA  Anesthesia Plan Comments: (TXA)       Anesthesia Quick Evaluation

## 2020-07-05 ENCOUNTER — Inpatient Hospital Stay (HOSPITAL_COMMUNITY): Payer: Medicaid Other | Admitting: Anesthesiology

## 2020-07-05 ENCOUNTER — Other Ambulatory Visit: Payer: Self-pay

## 2020-07-05 ENCOUNTER — Inpatient Hospital Stay (HOSPITAL_COMMUNITY)
Admission: RE | Admit: 2020-07-05 | Discharge: 2020-07-07 | DRG: 785 | Disposition: A | Discharge status: Home / Self Care | Payer: Medicaid Other | Attending: Family Medicine | Admitting: Family Medicine

## 2020-07-05 ENCOUNTER — Encounter (HOSPITAL_COMMUNITY)
Admission: RE | Disposition: A | Discharge status: Home / Self Care | Payer: Self-pay | Source: Home / Self Care | Attending: Family Medicine

## 2020-07-05 ENCOUNTER — Encounter (HOSPITAL_COMMUNITY): Payer: Self-pay | Admitting: Family Medicine

## 2020-07-05 DIAGNOSIS — Z880 Allergy status to penicillin: Secondary | ICD-10-CM | POA: Diagnosis not present

## 2020-07-05 DIAGNOSIS — E669 Obesity, unspecified: Secondary | ICD-10-CM | POA: Diagnosis not present

## 2020-07-05 DIAGNOSIS — O24420 Gestational diabetes mellitus in childbirth, diet controlled: Secondary | ICD-10-CM | POA: Diagnosis not present

## 2020-07-05 DIAGNOSIS — O99214 Obesity complicating childbirth: Secondary | ICD-10-CM | POA: Diagnosis present

## 2020-07-05 DIAGNOSIS — O34211 Maternal care for low transverse scar from previous cesarean delivery: Secondary | ICD-10-CM | POA: Diagnosis not present

## 2020-07-05 DIAGNOSIS — Z3A39 39 weeks gestation of pregnancy: Secondary | ICD-10-CM

## 2020-07-05 DIAGNOSIS — Z23 Encounter for immunization: Secondary | ICD-10-CM

## 2020-07-05 DIAGNOSIS — Z8759 Personal history of other complications of pregnancy, childbirth and the puerperium: Secondary | ICD-10-CM

## 2020-07-05 DIAGNOSIS — R8271 Bacteriuria: Secondary | ICD-10-CM | POA: Diagnosis present

## 2020-07-05 DIAGNOSIS — Z98891 History of uterine scar from previous surgery: Secondary | ICD-10-CM

## 2020-07-05 DIAGNOSIS — O24410 Gestational diabetes mellitus in pregnancy, diet controlled: Secondary | ICD-10-CM | POA: Diagnosis present

## 2020-07-05 DIAGNOSIS — O99891 Other specified diseases and conditions complicating pregnancy: Secondary | ICD-10-CM | POA: Diagnosis present

## 2020-07-05 DIAGNOSIS — Z302 Encounter for sterilization: Secondary | ICD-10-CM

## 2020-07-05 DIAGNOSIS — Z9851 Tubal ligation status: Secondary | ICD-10-CM

## 2020-07-05 DIAGNOSIS — Z8632 Personal history of gestational diabetes: Secondary | ICD-10-CM | POA: Diagnosis present

## 2020-07-05 DIAGNOSIS — Z87891 Personal history of nicotine dependence: Secondary | ICD-10-CM

## 2020-07-05 DIAGNOSIS — K429 Umbilical hernia without obstruction or gangrene: Secondary | ICD-10-CM | POA: Diagnosis present

## 2020-07-05 DIAGNOSIS — O0990 Supervision of high risk pregnancy, unspecified, unspecified trimester: Secondary | ICD-10-CM

## 2020-07-05 LAB — GLUCOSE, CAPILLARY
Glucose-Capillary: 78 mg/dL (ref 70–99)
Glucose-Capillary: 80 mg/dL (ref 70–99)

## 2020-07-05 SURGERY — Surgical Case
Anesthesia: Spinal | Wound class: Clean Contaminated

## 2020-07-05 MED ORDER — COCONUT OIL OIL
1.0000 | TOPICAL_OIL | Status: DC | PRN
Start: 2020-07-05 — End: 2020-07-07
  Administered 2020-07-06: 1 via TOPICAL

## 2020-07-05 MED ORDER — ONDANSETRON HCL 4 MG/2ML IJ SOLN
INTRAMUSCULAR | Status: DC | PRN
  Administered 2020-07-05: 4 mg via INTRAVENOUS

## 2020-07-05 MED ORDER — PRENATAL MULTIVITAMIN CH
1.0000 | ORAL_TABLET | Freq: Every day | ORAL | Status: DC
  Administered 2020-07-05 – 2020-07-07 (×3): 1 via ORAL
  Filled 2020-07-05 (×3): qty 1

## 2020-07-05 MED ORDER — TETANUS-DIPHTH-ACELL PERTUSSIS 5-2.5-18.5 LF-MCG/0.5 IM SUSY
0.5000 mL | PREFILLED_SYRINGE | Freq: Once | INTRAMUSCULAR | Status: AC
  Administered 2020-07-07: 0.5 mL via INTRAMUSCULAR
  Filled 2020-07-05: qty 0.5

## 2020-07-05 MED ORDER — KETOROLAC TROMETHAMINE 30 MG/ML IJ SOLN: 30.0000 mg | Freq: Four times a day (QID) | INTRAMUSCULAR | Status: AC | PRN

## 2020-07-05 MED ORDER — ACETAMINOPHEN 325 MG PO TABS: 650.0000 mg | ORAL_TABLET | ORAL | Status: DC | PRN

## 2020-07-05 MED ORDER — DEXAMETHASONE SODIUM PHOSPHATE 4 MG/ML IJ SOLN
INTRAMUSCULAR | Status: AC
  Filled 2020-07-05: qty 1

## 2020-07-05 MED ORDER — GENTAMICIN SULFATE 40 MG/ML IJ SOLN
5.0000 mg/kg | INTRAVENOUS | Status: AC
  Administered 2020-07-05: 340 mg via INTRAVENOUS
  Filled 2020-07-05: qty 8.5

## 2020-07-05 MED ORDER — NALBUPHINE HCL 10 MG/ML IJ SOLN: 5.0000 mg | INTRAMUSCULAR | Status: DC | PRN

## 2020-07-05 MED ORDER — OXYTOCIN-SODIUM CHLORIDE 30-0.9 UT/500ML-% IV SOLN
INTRAVENOUS | Status: AC
  Filled 2020-07-05: qty 500

## 2020-07-05 MED ORDER — MORPHINE SULFATE (PF) 0.5 MG/ML IJ SOLN
INTRAMUSCULAR | Status: AC
  Filled 2020-07-05: qty 10

## 2020-07-05 MED ORDER — BUPIVACAINE IN DEXTROSE 0.75-8.25 % IT SOLN
INTRATHECAL | Status: DC | PRN
  Administered 2020-07-05: 2 mL via INTRATHECAL

## 2020-07-05 MED ORDER — KETOROLAC TROMETHAMINE 30 MG/ML IJ SOLN: 30.0000 mg | Freq: Once | INTRAMUSCULAR | Status: AC

## 2020-07-05 MED ORDER — SIMETHICONE 80 MG PO CHEW: 80.0000 mg | CHEWABLE_TABLET | ORAL | Status: DC | PRN

## 2020-07-05 MED ORDER — MENTHOL 3 MG MT LOZG: 1.0000 | LOZENGE | OROMUCOSAL | Status: DC | PRN

## 2020-07-05 MED ORDER — LACTATED RINGERS IV SOLN: INTRAVENOUS | Status: DC

## 2020-07-05 MED ORDER — OXYCODONE HCL 5 MG/5ML PO SOLN: 5.0000 mg | Freq: Once | ORAL | Status: DC | PRN

## 2020-07-05 MED ORDER — PHENYLEPHRINE HCL-NACL 20-0.9 MG/250ML-% IV SOLN
INTRAVENOUS | Status: DC | PRN
  Administered 2020-07-05: 60 ug/min via INTRAVENOUS

## 2020-07-05 MED ORDER — SOD CITRATE-CITRIC ACID 500-334 MG/5ML PO SOLN
ORAL | Status: AC
  Filled 2020-07-05: qty 30

## 2020-07-05 MED ORDER — SIMETHICONE 80 MG PO CHEW
80.0000 mg | CHEWABLE_TABLET | Freq: Three times a day (TID) | ORAL | Status: DC
  Administered 2020-07-05 – 2020-07-07 (×6): 80 mg via ORAL
  Filled 2020-07-05 (×6): qty 1

## 2020-07-05 MED ORDER — TRANEXAMIC ACID-NACL 1000-0.7 MG/100ML-% IV SOLN
INTRAVENOUS | Status: AC
  Filled 2020-07-05: qty 100

## 2020-07-05 MED ORDER — DEXAMETHASONE SODIUM PHOSPHATE 4 MG/ML IJ SOLN
INTRAMUSCULAR | Status: DC | PRN
  Administered 2020-07-05: 4 mg via INTRAVENOUS

## 2020-07-05 MED ORDER — KETOROLAC TROMETHAMINE 30 MG/ML IJ SOLN
30.0000 mg | Freq: Once | INTRAMUSCULAR | Status: AC | PRN
  Administered 2020-07-05: 30 mg via INTRAVENOUS

## 2020-07-05 MED ORDER — ENOXAPARIN SODIUM 40 MG/0.4ML ~~LOC~~ SOLN
40.0000 mg | SUBCUTANEOUS | Status: DC
  Administered 2020-07-06 – 2020-07-07 (×2): 40 mg via SUBCUTANEOUS
  Filled 2020-07-05 (×2): qty 0.4

## 2020-07-05 MED ORDER — DIPHENHYDRAMINE HCL 25 MG PO CAPS
25.0000 mg | ORAL_CAPSULE | Freq: Four times a day (QID) | ORAL | Status: DC | PRN
Start: 2020-07-05 — End: 2020-07-07

## 2020-07-05 MED ORDER — MEPERIDINE HCL 25 MG/ML IJ SOLN
6.2500 mg | INTRAMUSCULAR | Status: DC | PRN
Start: 2020-07-05 — End: 2020-07-05

## 2020-07-05 MED ORDER — NALBUPHINE HCL 10 MG/ML IJ SOLN
5.0000 mg | Freq: Once | INTRAMUSCULAR | Status: DC | PRN
Start: 2020-07-05 — End: 2020-07-07

## 2020-07-05 MED ORDER — DIPHENHYDRAMINE HCL 50 MG/ML IJ SOLN: 12.5000 mg | INTRAMUSCULAR | Status: DC | PRN

## 2020-07-05 MED ORDER — DIBUCAINE (PERIANAL) 1 % EX OINT
1.0000 | TOPICAL_OINTMENT | CUTANEOUS | Status: DC | PRN
Start: 2020-07-05 — End: 2020-07-07

## 2020-07-05 MED ORDER — SOD CITRATE-CITRIC ACID 500-334 MG/5ML PO SOLN
30.0000 mL | ORAL | Status: AC
  Administered 2020-07-05: 30 mL via ORAL

## 2020-07-05 MED ORDER — SODIUM CHLORIDE 0.9 % IR SOLN
Status: DC | PRN
  Administered 2020-07-05: 1

## 2020-07-05 MED ORDER — SODIUM CHLORIDE 0.9% FLUSH: 3.0000 mL | INTRAVENOUS | Status: DC | PRN

## 2020-07-05 MED ORDER — MEPERIDINE HCL 25 MG/ML IJ SOLN: 6.2500 mg | INTRAMUSCULAR | Status: DC | PRN

## 2020-07-05 MED ORDER — FENTANYL CITRATE (PF) 100 MCG/2ML IJ SOLN
INTRAMUSCULAR | Status: DC | PRN
  Administered 2020-07-05: 15 ug via INTRATHECAL

## 2020-07-05 MED ORDER — ONDANSETRON HCL 4 MG/2ML IJ SOLN: 4.0000 mg | Freq: Three times a day (TID) | INTRAMUSCULAR | Status: DC | PRN

## 2020-07-05 MED ORDER — WITCH HAZEL-GLYCERIN EX PADS: 1.0000 "application " | MEDICATED_PAD | CUTANEOUS | Status: DC | PRN

## 2020-07-05 MED ORDER — OXYCODONE HCL 5 MG PO TABS: 5.0000 mg | ORAL_TABLET | Freq: Once | ORAL | Status: DC | PRN

## 2020-07-05 MED ORDER — CLINDAMYCIN PHOSPHATE 900 MG/50ML IV SOLN
900.0000 mg | INTRAVENOUS | Status: AC
  Administered 2020-07-05: 900 mg via INTRAVENOUS

## 2020-07-05 MED ORDER — IBUPROFEN 800 MG PO TABS
800.0000 mg | ORAL_TABLET | Freq: Four times a day (QID) | ORAL | Status: DC
  Administered 2020-07-06 – 2020-07-07 (×5): 800 mg via ORAL
  Filled 2020-07-05 (×5): qty 1

## 2020-07-05 MED ORDER — OXYTOCIN-SODIUM CHLORIDE 30-0.9 UT/500ML-% IV SOLN
INTRAVENOUS | Status: DC | PRN
  Administered 2020-07-05: 350 mL via INTRAVENOUS

## 2020-07-05 MED ORDER — CLINDAMYCIN PHOSPHATE 900 MG/50ML IV SOLN
INTRAVENOUS | Status: AC
  Filled 2020-07-05: qty 50

## 2020-07-05 MED ORDER — SENNOSIDES-DOCUSATE SODIUM 8.6-50 MG PO TABS
2.0000 | ORAL_TABLET | ORAL | Status: DC
  Administered 2020-07-05 – 2020-07-07 (×3): 2 via ORAL
  Filled 2020-07-05 (×3): qty 2

## 2020-07-05 MED ORDER — PHENYLEPHRINE HCL-NACL 20-0.9 MG/250ML-% IV SOLN
INTRAVENOUS | Status: AC
  Filled 2020-07-05: qty 250

## 2020-07-05 MED ORDER — SCOPOLAMINE 1 MG/3DAYS TD PT72
1.0000 | MEDICATED_PATCH | Freq: Once | TRANSDERMAL | Status: DC
  Filled 2020-07-05: qty 1

## 2020-07-05 MED ORDER — TRANEXAMIC ACID-NACL 1000-0.7 MG/100ML-% IV SOLN
INTRAVENOUS | Status: DC | PRN
  Administered 2020-07-05: 1000 mg via INTRAVENOUS

## 2020-07-05 MED ORDER — LACTATED RINGERS IV SOLN: INTRAVENOUS | Status: DC | PRN

## 2020-07-05 MED ORDER — KETOROLAC TROMETHAMINE 30 MG/ML IJ SOLN
30.0000 mg | Freq: Four times a day (QID) | INTRAMUSCULAR | Status: AC
  Administered 2020-07-05 – 2020-07-06 (×2): 30 mg via INTRAVENOUS
  Filled 2020-07-05 (×3): qty 1

## 2020-07-05 MED ORDER — NALOXONE HCL 0.4 MG/ML IJ SOLN: 0.4000 mg | INTRAMUSCULAR | Status: DC | PRN

## 2020-07-05 MED ORDER — ACETAMINOPHEN 500 MG PO TABS
1000.0000 mg | ORAL_TABLET | Freq: Four times a day (QID) | ORAL | Status: AC
  Administered 2020-07-05 (×3): 1000 mg via ORAL
  Filled 2020-07-05 (×3): qty 2

## 2020-07-05 MED ORDER — NALBUPHINE HCL 10 MG/ML IJ SOLN: 5.0000 mg | Freq: Once | INTRAMUSCULAR | Status: DC | PRN

## 2020-07-05 MED ORDER — ONDANSETRON HCL 4 MG/2ML IJ SOLN
INTRAMUSCULAR | Status: AC
  Filled 2020-07-05: qty 2

## 2020-07-05 MED ORDER — MORPHINE SULFATE (PF) 0.5 MG/ML IJ SOLN
INTRAMUSCULAR | Status: DC | PRN
  Administered 2020-07-05: .15 mg via INTRATHECAL

## 2020-07-05 MED ORDER — PROMETHAZINE HCL 25 MG/ML IJ SOLN: 6.2500 mg | INTRAMUSCULAR | Status: DC | PRN

## 2020-07-05 MED ORDER — MEASLES, MUMPS & RUBELLA VAC IJ SOLR: 0.5000 mL | Freq: Once | INTRAMUSCULAR | Status: DC

## 2020-07-05 MED ORDER — OXYTOCIN-SODIUM CHLORIDE 30-0.9 UT/500ML-% IV SOLN: 2.5000 [IU]/h | INTRAVENOUS | Status: AC

## 2020-07-05 MED ORDER — OXYCODONE HCL 5 MG PO TABS: 5.0000 mg | ORAL_TABLET | ORAL | Status: DC | PRN

## 2020-07-05 MED ORDER — KETOROLAC TROMETHAMINE 30 MG/ML IJ SOLN
INTRAMUSCULAR | Status: AC
  Filled 2020-07-05: qty 1

## 2020-07-05 MED ORDER — DIPHENHYDRAMINE HCL 25 MG PO CAPS: 25.0000 mg | ORAL_CAPSULE | ORAL | Status: DC | PRN

## 2020-07-05 MED ORDER — HYDROMORPHONE HCL 1 MG/ML IJ SOLN
0.2500 mg | INTRAMUSCULAR | Status: DC | PRN
Start: 2020-07-05 — End: 2020-07-05

## 2020-07-05 MED ORDER — DEXTROSE 5 % IV SOLN
1.0000 ug/kg/h | INTRAVENOUS | Status: DC | PRN
Start: 2020-07-05 — End: 2020-07-07
  Filled 2020-07-05: qty 5

## 2020-07-05 MED ORDER — FENTANYL CITRATE (PF) 100 MCG/2ML IJ SOLN
INTRAMUSCULAR | Status: AC
  Filled 2020-07-05: qty 2

## 2020-07-05 SURGICAL SUPPLY — 28 items
APL SKNCLS STERI-STRIP NONHPOA (GAUZE/BANDAGES/DRESSINGS) ×2
BENZOIN TINCTURE PRP APPL 2/3 (GAUZE/BANDAGES/DRESSINGS) ×3 IMPLANT
CHLORAPREP W/TINT 26ML (MISCELLANEOUS) ×2 IMPLANT
CLIP FILSHIE TUBAL LIGA STRL (Clip) ×1 IMPLANT
CLOSURE STERI STRIP 1/2 X4 (GAUZE/BANDAGES/DRESSINGS) ×1 IMPLANT
CLOTH BEACON ORANGE TIMEOUT ST (SAFETY) ×2 IMPLANT
DRSG OPSITE POSTOP 4X10 (GAUZE/BANDAGES/DRESSINGS) ×3 IMPLANT
ELECT REM PT RETURN 9FT ADLT (ELECTROSURGICAL) ×2
ELECTRODE REM PT RTRN 9FT ADLT (ELECTROSURGICAL) ×1 IMPLANT
GLOVE BIOGEL PI IND STRL 7.0 (GLOVE) ×3 IMPLANT
GLOVE BIOGEL PI INDICATOR 7.0 (GLOVE) ×3
GLOVE ECLIPSE 6.5 STRL STRAW (GLOVE) ×2 IMPLANT
GOWN STRL REUS W/ TWL LRG LVL3 (GOWN DISPOSABLE) ×2 IMPLANT
GOWN STRL REUS W/TWL LRG LVL3 (GOWN DISPOSABLE) ×4
NS IRRIG 1000ML POUR BTL (IV SOLUTION) ×2 IMPLANT
PAD OB MATERNITY 4.3X12.25 (PERSONAL CARE ITEMS) ×2 IMPLANT
PAD PREP 24X48 CUFFED NSTRL (MISCELLANEOUS) ×2 IMPLANT
RETRACTOR WND ALEXIS 25 LRG (MISCELLANEOUS) IMPLANT
RTRCTR WOUND ALEXIS 25CM LRG (MISCELLANEOUS) ×2
STRIP CLOSURE SKIN 1/2X4 (GAUZE/BANDAGES/DRESSINGS) ×3 IMPLANT
SUT PLAIN 2 0 XLH (SUTURE) ×2 IMPLANT
SUT VIC AB 0 CT1 36 (SUTURE) ×4 IMPLANT
SUT VIC AB 2-0 CT1 27 (SUTURE) ×2
SUT VIC AB 2-0 CT1 TAPERPNT 27 (SUTURE) ×1 IMPLANT
SUT VIC AB 4-0 KS 27 (SUTURE) ×2 IMPLANT
TOWEL OR 17X24 6PK STRL BLUE (TOWEL DISPOSABLE) ×6 IMPLANT
TRAY FOLEY CATH SILVER 16FR (SET/KITS/TRAYS/PACK) ×2 IMPLANT
WATER STERILE IRR 1000ML POUR (IV SOLUTION) ×2 IMPLANT

## 2020-07-05 NOTE — Op Note (Signed)
[name] PROCEDURE DATE: 07/05/2020  PREOPERATIVE DIAGNOSES: Intrauterine pregnancy at [redacted]w[redacted]d weeks gestation; patient declines vag del attempt; undesired fertility  POSTOPERATIVE DIAGNOSES: The same; OT presentation  PROCEDURE: Repeat Low Transverse Cesarean Section, Bilateral Tubal Sterilization using Filshie clips  SURGEON:  Federico Flake, MD  ASSISTANT:  Mart Piggs MD  ANESTHESIOLOGIST:Anesthesiologist: Lannie Fields, DO CRNA: Trellis Paganini, CRNA  INDICATIONS: Joyce Ball is a 25 y.o. G2P1001 at [redacted]w[redacted]d here for cesarean section and bilateral tubal sterilization secondary to the indications listed under preoperative diagnoses; please see preoperative note for further details.  The risks of surgery were discussed with the patient including but were not limited to: bleeding which may require transfusion or reoperation; infection which may require antibiotics; injury to bowel, bladder, ureters or other surrounding organs; injury to the fetus; need for additional procedures including hysterectomy in the event of a life-threatening hemorrhage; placental abnormalities wth subsequent pregnancies, incisional problems, thromboembolic phenomenon and other postoperative/anesthesia complications.  Patient also desires permanent sterilization- we discussed this prenatally, preoperatively and affirmed during the case.  Other reversible forms of contraception were discussed with patient; she declines all other modalities. Risks of procedure discussed with patient including but not limited to: risk of regret, permanence of method, bleeding, infection, injury to surrounding organs and need for additional procedures.  Failure risk of 1-2% with increased risk of ectopic gestation if pregnancy occurs was also discussed with patient.  The patient concurred with the proposed plan, giving informed written consent for the procedures.    FINDINGS:  Viable female infant in cephalic- OT  presentation.  Apgars 8 and 8.  Clear amniotic fluid.  Intact placenta, three vessel cord.  Normal uterus, fallopian tubes and ovaries bilaterally. Fallopian tubes sterilized with Filshie clips bilaterally.  ANESTHESIA: Spinal INTRAVENOUS FLUIDS: 2950 ml ESTIMATED BLOOD LOSS:43ml URINE OUTPUT:  SPECIMENS: L&D COMPLICATIONS: None immediate. TXA given prophylactically  PROCEDURE IN DETAIL:  The patient preoperatively received intravenous antibiotics and had sequential compression devices applied to her lower extremities.   She was then taken to the operating room where spinal anesthesia was administeredthe epidural anesthesia was dosed up to surgical level and was found to be adequate. She was then placed in a dorsal supine position with a leftward tilt, and prepped and draped in a sterile manner.  A foley catheter was placed into her bladder and attached to constant gravity.  After an adequate timeout was performed, a Pfannenstiel skin incision was made with scalpel and carried through to the underlying layer of fascia. The fascia was incised in the midline, and this incision was extended bilaterally using the Mayo scissors.  Kocher clamps were applied to the superior aspect of the fascial incision and the underlying rectus muscles were dissected off bluntly. A similar process was carried out on the inferior aspect of the fascial incision. The rectus muscles were separated in the midline bluntly and the peritoneum was entered bluntly. Attention was turned to the lower uterine segment where a low transverse hysterotomy was made with a scalpel and extended bilaterally bluntly.  The infant was successfully delivered, the cord was clamped and cut and the infant was handed over to awaiting neonatology team. Uterine massage was then administered, and the placenta delivered intact with a three-vessel cord. The uterus was then cleared of clot and debris.  The hysterotomy was closed with 0 Vicryl in a running  locked fashion, and an imbricating layer was also placed with 0 Vicryl.   Prior to BTS, I spoke the  patient and confirmed again her desire for permanent sterilization. Attention was then turned to the fallopian tubes, and Filshie clips were placed about 3 cm from the cornua, with care given to incorporate the underlying mesosalpinx on both sides, allowing for bilateral tubal sterilization. The pelvis was cleared of all clot and debris. Hemostasis was confirmed on all surfaces.  The peritoneum and the muscles were reapproximated using 0 Vicryl interrupted stitches. The fascia was then closed using 0 Vicryl in a running fashion.  The subcutaneous layer was irrigated, then reapproximated with 2-0 plain gut interrupted stitches.  The skin was closed with a 4-0 Vicryl subcuticular stitch. The patient tolerated the procedure well. Sponge, lap, instrument and needle counts were correct x 2.  She was taken to the recovery room in stable condition.   Federico Flake, MD, MPH, ABFM Attending Physician Faculty Practice- [facility] \

## 2020-07-05 NOTE — Discharge Instructions (Signed)
-take tylenol 1000 mg every 6 hours as needed for pain, alternate with ibuprofen 600 mg every 6 hours -take oxycodone as needed if tylenol and ibuprofen aren't working -drink plenty of water to help with breastfeeding -continue prenatal vitamins while you are breastfeeding -take iron pills every other day with vitamin c, this will help healing as well as breast feeding   Postpartum Care After Cesarean Delivery This sheet gives you information about how to care for yourself from the time you deliver your baby to up to 6-12 weeks after delivery (postpartum period). Your health care provider may also give you more specific instructions. If you have problems or questions, contact your health care provider. Follow these instructions at home: Medicines  Take over-the-counter and prescription medicines only as told by your health care provider.  If you were prescribed an antibiotic medicine, take it as told by your health care provider. Do not stop taking the antibiotic even if you start to feel better.  Ask your health care provider if the medicine prescribed to you: ? Requires you to avoid driving or using heavy machinery. ? Can cause constipation. You may need to take actions to prevent or treat constipation, such as:  Drink enough fluid to keep your urine pale yellow.  Take over-the-counter or prescription medicines.  Eat foods that are high in fiber, such as beans, whole grains, and fresh fruits and vegetables.  Limit foods that are high in fat and processed sugars, such as fried or sweet foods. Activity  Gradually return to your normal activities as told by your health care provider.  Avoid activities that take a lot of effort and energy (are strenuous) until approved by your health care provider. Walking at a slow to moderate pace is usually safe. Ask your health care provider what activities are safe for you. ? Do not lift anything that is heavier than your baby or 10 lb (4.5 kg) as  told by your health care provider. ? Do not vacuum, climb stairs, or drive a car for as long as told by your health care provider.  If possible, have someone help you at home until you are able to do your usual activities yourself.  Rest as much as possible. Try to rest or take naps while your baby is sleeping. Vaginal bleeding  It is normal to have vaginal bleeding (lochia) after delivery. Wear a sanitary pad to absorb vaginal bleeding and discharge. ? During the first week after delivery, the amount and appearance of lochia is often similar to a menstrual period. ? Over the next few weeks, it will gradually decrease to a dry, yellow-brown discharge. ? For most women, lochia stops completely by 4-6 weeks after delivery. Vaginal bleeding can vary from woman to woman.  Change your sanitary pads frequently. Watch for any changes in your flow, such as: ? A sudden increase in volume. ? A change in color. ? Large blood clots.  If you pass a blood clot, save it and call your health care provider to discuss. Do not flush blood clots down the toilet before you get instructions from your health care provider.  Do not use tampons or douches until your health care provider says this is safe.  If you are not breastfeeding, your period should return 6-8 weeks after delivery. If you are breastfeeding, your period may return anytime between 8 weeks after delivery and the time that you stop breastfeeding. Perineal care  If your C-section (Cesarean section) was unplanned, and you were   allowed to labor and push before delivery, you may have pain, swelling, and discomfort of the tissue between your vaginal opening and your anus (perineum). You may also have an incision in the tissue (episiotomy) or the tissue may have torn during delivery. Follow these instructions as told by your health care provider: ? Keep your perineum clean and dry as told by your health care provider. Use medicated pads and  pain-relieving sprays and creams as directed. ? If you have an episiotomy or vaginal tear, check the area every day for signs of infection. Check for:  Redness, swelling, or pain.  Fluid or blood.  Warmth.  Pus or a bad smell. ? You may be given a squirt bottle to use instead of wiping to clean the perineum area after you go to the bathroom. As you start healing, you may use the squirt bottle before wiping yourself. Make sure to wipe gently. ? To relieve pain caused by an episiotomy, vaginal tear, or hemorrhoids, try taking a warm sitz bath 2-3 times a day. A sitz bath is a warm water bath that is taken while you are sitting down. The water should only come up to your hips and should cover your buttocks.   Breast care  Within the first few days after delivery, your breasts may feel heavy, full, and uncomfortable (breast engorgement). You may also have milk leaking from your breasts. Your health care provider can suggest ways to help relieve breast discomfort. Breast engorgement should go away within a few days.  If you are breastfeeding: ? Wear a bra that supports your breasts and fits you well. ? Keep your nipples clean and dry. Apply creams and ointments as told by your health care provider. ? You may need to use breast pads to absorb milk leakage. ? You may have uterine contractions every time you breastfeed for several weeks after delivery. Uterine contractions help your uterus return to its normal size. ? If you have any problems with breastfeeding, work with your health care provider or a lactation consultant.  If you are not breastfeeding: ? Avoid touching your breasts as this can make your breasts produce more milk. ? Wear a well-fitting bra and use cold packs to help with swelling. ? Do not squeeze out (express) milk. This causes you to make more milk. Intimacy and sexuality  Ask your health care provider when you can engage in sexual activity. This may depend on your: ? Risk  of infection. ? Healing rate. ? Comfort and desire to engage in sexual activity.  You are able to get pregnant after delivery, even if you have not had your period. If desired, talk with your health care provider about methods of family planning or birth control (contraception). Lifestyle  Do not use any products that contain nicotine or tobacco, such as cigarettes, e-cigarettes, and chewing tobacco. If you need help quitting, ask your health care provider.  Do not drink alcohol, especially if you are breastfeeding. Eating and drinking  Drink enough fluid to keep your urine pale yellow.  Eat high-fiber foods every day. These may help prevent or relieve constipation. High-fiber foods include: ? Whole grain cereals and breads. ? Brown rice. ? Beans. ? Fresh fruits and vegetables.  Take your prenatal vitamins until your postpartum checkup or until your health care provider tells you it is okay to stop.   General instructions  Keep all follow-up visits for you and your baby as told by your health care provider. Most women visit   their health care provider for a postpartum checkup within the first 3-6 weeks after delivery. Contact a health care provider if you:  Feel unable to cope with the changes that a new baby brings to your life, and these feelings do not go away.  Feel unusually sad or worried.  Have breasts that are painful, hard, or turn red.  Have a fever.  Have trouble holding urine or keeping urine from leaking.  Have little or no interest in activities you used to enjoy.  Have not breastfed at all and you have not had a menstrual period for 12 weeks after delivery.  Have stopped breastfeeding and you have not had a menstrual period for 12 weeks after you stopped breastfeeding.  Have questions about caring for yourself or your baby.  Pass a blood clot from your vagina. Get help right away if you:  Have chest pain.  Have difficulty breathing.  Have sudden,  severe leg pain.  Have severe pain or cramping in your abdomen.  Bleed from your vagina so much that you fill more than one sanitary pad in one hour. Bleeding should not be heavier than your heaviest period.  Develop a severe headache.  Faint.  Have blurred vision or spots in your vision.  Have a bad-smelling vaginal discharge.  Have thoughts about hurting yourself or your baby. If you ever feel like you may hurt yourself or others, or have thoughts about taking your own life, get help right away. You can go to your nearest emergency department or call:  Your local emergency services (911 in the [place]  A suicide crisis helpline, such as the National Suicide Prevention Lifeline at 1-800-273-8255. This is open 24 hours a day. Summary  The period of time from when you deliver your baby to up to 6-12 weeks after delivery is called the postpartum period.  Gradually return to your normal activities as told by your health care provider.  Keep all follow-up visits for you and your baby as told by your health care provider. This information is not intended to replace advice given to you by your health care provider. Make sure you discuss any questions you have with your health care provider. Document Revised: 12/21/2017 Document Reviewed: 12/21/2017 Elsevier Patient Education  2021 [employer]  

## 2020-07-05 NOTE — Transfer of Care (Signed)
Immediate Anesthesia Transfer of Care Note  Patient: Joyce Ball  Procedure(s) Performed: CESAREAN SECTION (N/A )  Patient Location: PACU  Anesthesia Type:Spinal  Level of Consciousness: awake, alert  and patient cooperative  Airway & Oxygen Therapy: Patient Spontanous Breathing  Post-op Assessment: Report given to RN and Post -op Vital signs reviewed and stable  Post vital signs: Reviewed and stable  Last Vitals:  Vitals Value Taken Time  BP 107/68 07/05/20 0845  Temp    Pulse 64 07/05/20 0848  Resp 16 07/05/20 0848  SpO2 98 % 07/05/20 0848  Vitals shown include unvalidated device data.  Last Pain:  Vitals:   07/05/20 0559  TempSrc: Oral      Patients Stated Pain Goal: 0 (07/05/20 0559)  Complications: No complications documented.

## 2020-07-05 NOTE — Anesthesia Postprocedure Evaluation (Signed)
Anesthesia Post Note  Patient: Joyce Ball  Procedure(s) Performed: CESAREAN SECTION (N/A )     Patient location during evaluation: PACU Anesthesia Type: Spinal Level of consciousness: awake and alert and oriented Pain management: pain level controlled Vital Signs Assessment: post-procedure vital signs reviewed and stable Respiratory status: spontaneous breathing, nonlabored ventilation and respiratory function stable Cardiovascular status: blood pressure returned to baseline and stable Postop Assessment: no headache, no backache, spinal receding and patient able to bend at knees Anesthetic complications: no   No complications documented.  Last Vitals:  Vitals:   07/05/20 1033 07/05/20 1050  BP:  101/68  Pulse: (!) 59 (!) 54  Resp: 16 16  Temp:  36.6 C  SpO2: 100% 100%    Last Pain:  Vitals:   07/05/20 1050  TempSrc: Oral  PainSc: 5    Pain Goal: Patients Stated Pain Goal: 0 (07/05/20 0559)                 Lannie Fields

## 2020-07-05 NOTE — Anesthesia Procedure Notes (Signed)
Spinal  Patient location during procedure: OR Start time: 07/05/2020 7:23 AM End time: 07/05/2020 7:27 AM Staffing Performed: anesthesiologist  Anesthesiologist: Lannie Fields, DO Preanesthetic Checklist Completed: patient identified, IV checked, risks and benefits discussed, surgical consent, monitors and equipment checked, pre-op evaluation and timeout performed Spinal Block Patient position: sitting Prep: DuraPrep and site prepped and draped Patient monitoring: cardiac monitor, continuous pulse ox and blood pressure Approach: midline Location: L3-4 Injection technique: single-shot Needle Needle type: Pencan  Needle gauge: 24 G Needle length: 9 cm Assessment Sensory level: T6 Additional Notes Functioning IV was confirmed and monitors were applied. Sterile prep and drape, including hand hygiene and sterile gloves were used. The patient was positioned and the spine was prepped. The skin was anesthetized with lidocaine.  Free flow of clear CSF was obtained prior to injecting local anesthetic into the CSF.  The spinal needle aspirated freely following injection.  The needle was carefully withdrawn.  The patient tolerated the procedure well.

## 2020-07-05 NOTE — Lactation Note (Addendum)
This note was copied from a baby's chart. Lactation Consultation Note  Patient Name: Joyce Ball [id number] Date: 07/05/2020 Reason for consult: Initial assessment;Term Age:24 hours P2, 12 hour term female infant. Per mom, infant has been latching well. Per mom, infant had 3 voids since birth. Mom was attempting to latch infant at the breast  when [facility] entered room, LC suggested mom undress infant and breastfeed infant  STS.  Mom burped infant after 8 minutes of feeding, infant was gagging at breast and infant has medium emesis ( clear mucous) . Mom re- latched infant on her right breast using the football hold position, infant latched with depth, swallows observed, infant was still BF after 12 minutes when LC left the room. Per mom, most feedings have been 20 to 30 minutes in length, she has been latching infant on both breast for most feedings. Mom knows to BF infant according to cues, 8 to 12+ times or more within 24 hours, STS. Mom knows to call RN or LC if she has any questions, concerns or need assistance with latching infant at the breast. LC discussed input and output with mom. Mom made aware of O/P services, breastfeeding support groups, community resources, and our phone # for post-discharge questions.   Maternal Data Has patient been taught Hand Expression?: Yes Does the patient have breastfeeding experience prior to this delivery?: Yes How long did the patient breastfeed?: Per mom, she stopped BF her 52 year old daughter in hospital due painful latches.  Feeding Mother's Current Feeding Choice: Breast Milk  LATCH Score Latch: Grasps breast easily, tongue down, lips flanged, rhythmical sucking.  Audible Swallowing: Spontaneous and intermittent  Type of Nipple: Everted at rest and after stimulation  Comfort (Breast/Nipple): Soft / non-tender  Hold (Positioning): Assistance needed to correctly position infant at breast and maintain latch.  LATCH Score: 9   Lactation  Tools Discussed/Used Tools: Pump Breast pump type: Manual Pump Education: Setup, frequency, and cleaning;Milk Storage Reason for Pumping: prn, mom doesn't have breastpump at home. Pumping frequency: prn  Interventions Interventions: Breast feeding basics reviewed;Assisted with latch;Skin to skin;Breast massage;Hand express;Adjust position;Support pillows;Position options;Expressed milk;Education  Discharge Pump: Manual WIC Program: No  Consult Status Consult Status: Follow-up Date: 07/06/20 Follow-up type: In-patient    Danelle Earthly 07/05/2020, 8:50 PM

## 2020-07-05 NOTE — Discharge Summary (Signed)
Postpartum Discharge Summary  Date of Service updated 07/07/20     Patient Name: Joyce Ball DOB: [birth year] MRN: [id number]  Date of admission: 07/05/2020 Delivery date:07/05/2020  Delivering provider: Caren Macadam  Date of discharge: 07/07/2020  Admitting diagnosis: S/P repeat low transverse C-section [Z98.891] Intrauterine pregnancy: [redacted]w[redacted]d    Secondary diagnosis:  Active Problems:   History of postpartum hemorrhage   Cesarean delivery delivered   History of cesarean section   Supervision of high risk pregnancy, antepartum   Asymptomatic bacteriuria during pregnancy in first trimester   Gestational diabetes mellitus, class A1   Umbilical hernia   Request for sterilization   S/P repeat low transverse C-section   History of bilateral tubal ligation  Additional problems: none    Discharge diagnosis: Term Pregnancy Delivered and GDM A1                                              Post partum procedures:postpartum tubal ligation, IV venofer administered Augmentation: N/A Complications: None  Hospital course: Sceduled C/S   25y.o. yo G2P1001 at 315w1das admitted to the hospital 07/05/2020 for scheduled cesarean section with the following indication:Elective Repeat.Delivery details are as follows:  Membrane Rupture Time/Date: 7:51 AM ,07/05/2020   Delivery Method:C-Section, Low Transverse  Details of operation can be found in separate operative note.  Patient had an uncomplicated postpartum course.  She is ambulating, tolerating a regular diet, passing flatus, and urinating well. Patient is discharged home in stable condition on  07/07/20        Newborn Data: Birth date:07/05/2020  Birth time:7:52 AM  Gender:Female  Living status:Living  Apgars:8 ,8  Weight:3779 g     Magnesium Sulfate received: No BMZ received: No Rhophylac:N/A MMR:N/A T-DaP:offered postpartum Flu: No  Transfusion:No  Physical exam  Vitals:   07/06/20 0750 07/06/20 1525 07/06/20 2300  07/07/20 0502  BP: 109/68 114/67 120/78 118/76  Pulse: 71 79 81 78  Resp: 18 18 18 18   Temp: (!) 97.5 F (36.4 C) 98.8 F (37.1 C) 98.2 F (36.8 C) 98.2 F (36.8 C)  TempSrc: Oral Oral Oral Oral  SpO2:  98%  100%  Weight:      Height:       General: alert, cooperative and no distress Lochia: appropriate Uterine Fundus: firm Incision: Dressing is clean, dry, and intact DVT Evaluation: No evidence of DVT seen on physical exam. Labs: Lab Results  Component Value Date   WBC 8.3 07/06/2020   HGB 8.8 (L) 07/06/2020   HCT 27.9 (L) 07/06/2020   MCV 87.2 07/06/2020   PLT 188 07/06/2020   CMP Latest Ref Rng & Units 08/25/2018  Glucose 70 - 99 mg/dL 85  BUN 6 - 20 mg/dL 9  Creatinine 0.44 - 1.00 mg/dL 0.94  Sodium 135 - 145 mmol/L 137  Potassium 3.5 - 5.1 mmol/L 4.0  Chloride 98 - 111 mmol/L 104  CO2 22 - 32 mmol/L 26  Calcium 8.9 - 10.3 mg/dL 9.4  Total Protein 6.5 - 8.1 g/dL -  Total Bilirubin 0.3 - 1.2 mg/dL -  Alkaline Phos 38 - 126 U/L -  AST 15 - 41 U/L -  ALT 14 - 54 U/L -   Edinburgh Score: Edinburgh Postnatal Depression Scale Screening Tool 07/06/2020  I have been able to laugh and see the funny side of things. 0  I have looked forward with enjoyment to things. 0  I have blamed myself unnecessarily when things went wrong. 0  I have been anxious or worried for no good reason. 0  I have felt scared or panicky for no good reason. 0  Things have been getting on top of me. 0  I have been so unhappy that I have had difficulty sleeping. 0  I have felt sad or miserable. 0  I have been so unhappy that I have been crying. 0  The thought of harming myself has occurred to me. 0  Edinburgh Postnatal Depression Scale Total 0     After visit meds:  Allergies as of 07/07/2020      Reactions   Sulfa Antibiotics Anaphylaxis   Penicillins Hives   Has patient had a PCN reaction causing immediate rash, facial/tongue/throat swelling, SOB or lightheadedness with hypotension:  No Has patient had a PCN reaction causing severe rash involving mucus membranes or  skin necrosis: No Has patient had a PCN reaction that required hospitalization No Has patient had a PCN reaction occurring within the last 10 years: No If all of the above answers are "NO", then may proceed with Cephalosporin use.   Sulfur       Medication List    STOP taking these medications   Accu-Chek Guide Me w/Device Kit   Accu-Chek Guide test strip Generic drug: glucose blood   Accu-Chek Softclix Lancets lancets   blood glucose meter kit and supplies   nystatin cream Commonly known as: MYCOSTATIN     TAKE these medications   acetaminophen 500 MG tablet Commonly known as: TYLENOL Take 2 tablets (1,000 mg total) by mouth every 6 (six) hours as needed for mild pain or moderate pain (temperature > 101.5.).   coconut oil Oil Apply 1 application topically as needed.   ferrous sulfate 325 (65 FE) MG tablet Take 1 tablet (325 mg total) by mouth every other day.   ibuprofen 600 MG tablet Commonly known as: ADVIL Take 1 tablet (600 mg total) by mouth every 6 (six) hours as needed.   oxyCODONE 5 MG immediate release tablet Commonly known as: Oxy IR/ROXICODONE Take 1-2 tablets (5-10 mg total) by mouth every 4 (four) hours as needed for moderate pain.   Prenatal Gummies/DHA & FA 0.4-32.5 MG Chew Chew 1 each by mouth daily.   vitamin C 250 MG tablet Commonly known as: ASCORBIC ACID Take 1 tablet (250 mg total) by mouth every other day. Take with iron        Discharge home in stable condition Infant Feeding: Breast Infant Disposition:home with mother Discharge instruction: per After Visit Summary and Postpartum booklet. Activity: Advance as tolerated. Pelvic rest for 6 weeks.  Diet: routine diet Future Appointments: Future Appointments  Date Time Provider [place]  07/11/2020 11:10 AM Chancy Milroy, MD [facility]   Follow up Visit: Message sent to [facility] 07/05/20 by  Sylvester Harder.   Please schedule this patient for a In person postpartum visit in 6 weeks with the following provider: Any provider. Additional Postpartum F/U:2 hour GTT and Incision check 1 week  High risk pregnancy complicated by: GDM Delivery mode:  C-Section, Low Transverse  Anticipated Birth Control:  BTL done PP   2/95/1884 Arrie Senate, MD

## 2020-07-05 NOTE — H&P (Signed)
Obstetric Preoperative History and Physical  Joyce Ball is a 25 y.o. G2P1001 with IUP at 47w1dpresenting for presenting for scheduled cesarean section, elective repeat. Last delivery was CS after laboring to 10 cm and several hours of pushing, failed vacuum.  No acute concerns.   @36  wk 3325g 84th%   Prenatal Course Source of Care: [facility]  with onset of care at 12 weeks Pregnancy complications or risks: Patient Active Problem List   Diagnosis Date Noted  . S/P repeat low transverse C-section 07/05/2020  . Request for sterilization 05/21/2020  . Umbilical hernia 151/76/1607 . Gestational diabetes mellitus, class A1 04/07/2020  . Asymptomatic bacteriuria during pregnancy in first trimester 01/04/2020  . Supervision of high risk pregnancy, antepartum 01/02/2020  . History of cesarean section 12/05/2019  . History of postpartum hemorrhage 10/07/2015   She plans to breastfeed She desires condoms for postpartum contraception.   Prenatal labs and studies: ABO, Rh: --/--/A POS (02/17 03710 Antibody: NEG (02/17 0907) Rubella: 10.70 (08/19 1337) RPR: NON REACTIVE (02/17 0858)  HBsAg: Negative (08/19 1337)  HIV: NON REACTIVE (02/17 0858)  GBS:--/Negative (02/01 1729) 2 hr Glucola  Failed---> GDM Genetic screening normal Anatomy UKoreanormal  Prenatal Transfer Tool  Maternal Diabetes: Yes:  Diabetes Type:  Diet controlled Genetic Screening: Normal Maternal Ultrasounds/Referrals: Normal Fetal Ultrasounds or other Referrals:  None Maternal Substance Abuse:  No Significant Maternal Medications:  None Significant Maternal Lab Results: Group B Strep negative  Past Medical History:  Diagnosis Date  . History of postpartum hemorrhage   . Medical history non-contributory     Past Surgical History:  Procedure Laterality Date  . arm surgery    . CESAREAN SECTION N/A 10/04/2015   Procedure: CESAREAN SECTION;  Surgeon: UOsborne Oman MD;  [facility]   Service: Obstetrics;  Laterality: N/A;  . TONSILLECTOMY      OB History  Gravida Para Term Preterm AB Living  2 1 1     1   SAB IAB Ectopic Multiple Live Births        0 1    # Outcome Date GA Lbr Len/2nd Weight Sex Delivery Anes PTL Lv  2 Current           1 Term 10/04/15 [id number]:01 / 00:41 3420 g F CS-LTranv EPI N LIV     Complications: Fetal Intolerance, Failed vacuum extraction delivery    Social History   Socioeconomic History  . Marital status: Single    Spouse name: Not on file  . Number of children: Not on file  . Years of education: Not on file  . Highest education level: Not on file  Occupational History    Employer: [employer]  Tobacco Use  . Smoking status: Former Smoker    Types: Cigarettes  . Smokeless tobacco: Never Used  . Tobacco comment: 1-2 cigarettes daily   Vaping Use  . Vaping Use: Never used  Substance and Sexual Activity  . Alcohol use: Not Currently    Comment: rarely   . Drug use: No  . Sexual activity: Yes    Birth control/protection: None  Other Topics Concern  . Not on file  Social History Narrative  . Not on file   Social Determinants of Health   Financial Resource Strain: Low Risk   . Difficulty of Paying Living Expenses: Not hard at all  Food Insecurity: No Food Insecurity  . Worried About RuCharity fundraisern the Last Year: Never true  .  Ran Out of Food in the Last Year: Never true  Transportation Needs: No Transportation Needs  . Lack of Transportation (Medical): No  . Lack of Transportation (Non-Medical): No  Physical Activity: Sufficiently Active  . Days of Exercise per Week: 7 days  . Minutes of Exercise per Session: 30 min  Stress: No Stress Concern Present  . Feeling of Stress : Not at all  Social Connections: Moderately Integrated  . Frequency of Communication with Friends and Family: Three times a week  . Frequency of Social Gatherings with Friends and Family: Three times a week  . Attends Religious Services: 1 to  4 times per year  . Active Member of Clubs or Organizations: No  . Attends Archivist Meetings: Never  . Marital Status: Living with partner    Family History  Problem Relation Age of Onset  . Valvular heart disease Paternal Grandfather   . Hypertension Paternal Grandfather   . Hypertension Paternal Grandmother   . Cancer Maternal Grandmother   . Breast cancer Maternal Grandmother   . Hypertension Maternal Grandmother   . Heart attack Maternal Grandfather   . Hypertension Maternal Grandfather   . Epilepsy Brother     Medications Prior to Admission  Medication Sig Dispense Refill Last Dose  . nystatin cream (MYCOSTATIN) Apply 1 application topically 2 (two) times daily. 30 g 11   . Prenatal MV-Min-FA-Omega-3 (PRENATAL GUMMIES/DHA & FA) 0.4-32.5 MG CHEW Chew 1 each by mouth daily.     . Accu-Chek Softclix Lancets lancets Use as instructed to check blood sugar 4 times daily 100 each 12   . blood glucose meter kit and supplies Dispense based on patient and insurance preference. Use up to four times daily as directed. (FOR ICD-10 E10.9, E11.9). 1 each 0   . Blood Glucose Monitoring Suppl (ACCU-CHEK GUIDE ME) w/Device KIT 1 each by Does not apply route 4 (four) times daily. 1 kit 0   . glucose blood (ACCU-CHEK GUIDE) test strip Use as instructed to check blood sugar 4 times daily 50 each 12     Allergies  Allergen Reactions  . Sulfa Antibiotics Anaphylaxis  . Penicillins Hives    Has patient had a PCN reaction causing immediate rash, facial/tongue/throat swelling, SOB or lightheadedness with hypotension: No Has patient had a PCN reaction causing severe rash involving mucus membranes or  skin necrosis: No Has patient had a PCN reaction that required hospitalization No Has patient had a PCN reaction occurring within the last 10 years: No If all of the above answers are "NO", then may proceed with Cephalosporin use.  . Sulfur     Review of Systems: Negative except for what  is mentioned in HPI.  Physical Exam: BP 118/81   Pulse 89   Temp 98 F (36.7 C) (Oral)   Resp 20   Ht 5' 2"  (1.575 m)   Wt 88.5 kg   LMP 10/05/2019   SpO2 100%   BMI 35.67 kg/m  FHR by Doppler: 131 bpm CONSTITUTIONAL: Well-developed, well-nourished female in no acute distress.  HENT:  Normocephalic, atraumatic, External right and left ear normal. Oropharynx is clear and moist EYES: Conjunctivae and EOM are normal. Pupils are equal, round, and reactive to light. No scleral icterus.  NECK: Normal range of motion, supple, no masses SKIN: Skin is warm and dry. No rash noted. Not diaphoretic. No erythema. No pallor. [place]: Alert and oriented to person, place, and time. Normal reflexes, muscle tone coordination. No cranial nerve deficit noted. PSYCHIATRIC:  Normal mood and affect. Normal behavior. Normal judgment and thought content. CARDIOVASCULAR: Normal heart rate noted, regular rhythm RESPIRATORY: Effort and breath sounds normal, no problems with respiration noted ABDOMEN: Soft, nontender, nondistended, gravid. Well-healed Pfannenstiel incision. PELVIC: Deferred MUSCULOSKELETAL: Normal range of motion. No edema and no tenderness. 2+ distal pulses.   Pertinent Labs/Studies:   Results for orders placed or performed during the hospital encounter of 07/05/20 (from the past 72 hour(s))  Glucose, capillary     Status: None   Collection Time: 07/05/20  6:06 AM  Result Value Ref Range   Glucose-Capillary 78 70 - 99 mg/dL    Comment: Glucose reference range applies only to samples taken after fasting for at least 8 hours.    Assessment and Plan :Albert Devaul is a 25 y.o. G2P1001 at 82w1dbeing admitted being admitted for scheduled cesarean section. The risks of cesarean section discussed with the patient included but were not limited to: bleeding which may require transfusion or reoperation; infection which may require antibiotics; injury to bowel, bladder, ureters or other  surrounding organs; injury to the fetus; need for additional procedures including hysterectomy in the event of a life-threatening hemorrhage; placental abnormalities wth subsequent pregnancies, incisional problems, thromboembolic phenomenon and other postoperative/anesthesia complications.   Patient desires permanent sterilization.  Other reversible forms of contraception were discussed with patient; she declines all other modalities. Risks of procedure discussed with patient including but not limited to: risk of regret, permanence of method, bleeding, infection, injury to surrounding organs and need for additional procedures.  Failure risk of 1-2 % with increased risk of ectopic gestation if pregnancy occurs was also discussed with patient.  Patient verbalized understanding of these risks and wants to proceed with sterilization. She confirmed multiple times in the presence of her partner that she desires permanent sterilization. I discussed performing Filshie clip sterilization. She agrees with this plan  The patient concurred with the proposed plan, giving informed written consent for the procedure. Patient has been NPO since last night she will remain NPO for procedure. Anesthesia and OR aware. Preoperative prophylactic antibiotics and SCDs ordered on call to the OR. To OR when ready.   #A1GDM: will check POD#1 BG #History of PPH: plan for TXA administration in OFaribault MD, MPH, ABFM [facility]

## 2020-07-06 LAB — CBC
HCT: 27.9 % — ABNORMAL LOW (ref 36.0–46.0)
Hemoglobin: 8.8 g/dL — ABNORMAL LOW (ref 12.0–15.0)
MCH: 27.5 pg (ref 26.0–34.0)
MCHC: 31.5 g/dL (ref 30.0–36.0)
MCV: 87.2 fL (ref 80.0–100.0)
Platelets: 188 10*3/uL (ref 150–400)
RBC: 3.2 MIL/uL — ABNORMAL LOW (ref 3.87–5.11)
RDW: 14.6 % (ref 11.5–15.5)
WBC: 8.3 10*3/uL (ref 4.0–10.5)
nRBC: 0 % (ref 0.0–0.2)

## 2020-07-06 LAB — BIRTH TISSUE RECOVERY COLLECTION (PLACENTA DONATION)

## 2020-07-06 LAB — GLUCOSE, CAPILLARY: Glucose-Capillary: 70 mg/dL (ref 70–99)

## 2020-07-06 MED ORDER — SODIUM CHLORIDE 0.9 % IV SOLN
500.0000 mg | Freq: Once | INTRAVENOUS | Status: AC
  Administered 2020-07-06: 500 mg via INTRAVENOUS
  Filled 2020-07-06: qty 25

## 2020-07-06 NOTE — Lactation Note (Signed)
This note was copied from a baby's chart. Lactation Consultation Note  Patient Name: Boy Exie Chrismer [id number] Date: 07/06/2020 Reason for consult: Follow-up assessment;Term;Infant weight loss;Maternal endocrine disorder Age:24 hours  Lactating Parent holding infant upon entry.  Lactating parent stated an attempt to feed prior to [facility] entry, but infant was too sleepy following circumcision.  Lactating parent mentioned soreness to right nipple and concerns about manual pump.   LC assisted with expressing colostrum for nipple soreness and also provided coconut oil and educated on benefits of use for nipple support and use during pumping.  LC taught hand expression and was able to express colostrum from both breast.  Lactating parent was able to express from right breast.    LC educated on benefits of colostrum, expectations after a circumcision, stool transition, milk transition, and cluster feeding, breast milk storage, cleaning pump parts, and massaging breast before a feed.   Feeding Plan 1. Continue feeding on demand; 8-12x within 24 hr 2. Express colostrum before bringing infant to breast 3. Continuing STS as often as possible 3. Continue to monitor I/O  Lactating parent aware of LC services and will contact lactation for any additional support; denies any additional questions or concerns at this time.   Maternal Data Has patient been taught Hand Expression?: Yes  Feeding Mother's Current Feeding Choice: Breast Milk  Lactation Tools Discussed/Used Tools: Coconut oil;Pump Breast pump type: Manual Pump Education: Setup, frequency, and cleaning;Milk Storage  Interventions Interventions: Breast feeding basics reviewed;Education;Coconut oil;Hand express;Breast massage;Hand pump;Breast compression  Discharge Pump: Manual  Consult Status Consult Status: Follow-up Date: 07/07/20 Follow-up type: In-patient    Scarlette Ar 07/06/2020, 7:00 PM

## 2020-07-06 NOTE — Progress Notes (Signed)
Subjective: Postpartum Day #1: Cesarean Delivery/BTL Patient reports tolerating PO, + flatus and no problems voiding. Breastfeeding going well. Amb without difficulty- no s/s anemia. Has voided since her foley was out.   Objective: Vital signs in last 24 hours: Temp:  [97.4 F (36.3 C)-98 F (36.7 C)] 97.5 F (36.4 C) (02/20 0750) Pulse Rate:  [51-71] 71 (02/20 0750) Resp:  [11-26] 18 (02/20 0750) BP: (99-121)/(63-80) 109/68 (02/20 0750) SpO2:  [97 %-100 %] 100 % (02/20 0600)  Fasting blood sugar: 70  Physical Exam:  General: alert, cooperative and no distress Lochia: appropriate Uterine Fundus: firm Incision: honeycomb intact, sm stain and unchanged DVT Evaluation: No evidence of DVT seen on physical exam.  Recent Labs    07/06/20 0524  HGB 8.8*  HCT 27.9*   (Hgb pre-op was 11.1)  Assessment/Plan: Status post Cesarean section. Doing well postoperatively.  Continue current care. Has already been consented for her son's circ. Anticipate d/c home tomorrow.  Arabella Merles CNM 07/06/2020, 9:02 AM

## 2020-07-07 MED ORDER — COCONUT OIL OIL: 1.0000 "application " | TOPICAL_OIL | 0 refills | Status: AC | PRN

## 2020-07-07 MED ORDER — FERROUS SULFATE 325 (65 FE) MG PO TABS: 325.0000 mg | ORAL_TABLET | ORAL | 11 refills | Status: AC

## 2020-07-07 MED ORDER — ACETAMINOPHEN 500 MG PO TABS: 1000.0000 mg | ORAL_TABLET | Freq: Four times a day (QID) | ORAL | Status: AC | PRN

## 2020-07-07 MED ORDER — VITAMIN C 250 MG PO TABS: 250.0000 mg | ORAL_TABLET | ORAL | Status: AC

## 2020-07-07 MED ORDER — OXYCODONE HCL 5 MG PO TABS: 5.0000 mg | ORAL_TABLET | ORAL | 0 refills | Status: DC | PRN

## 2020-07-07 MED ORDER — IBUPROFEN 600 MG PO TABS: 600.0000 mg | ORAL_TABLET | Freq: Four times a day (QID) | ORAL | Status: AC | PRN

## 2020-07-07 NOTE — Lactation Note (Signed)
This note was copied from a baby's chart. Lactation Consultation Note  Patient Name: Joyce Ball [id number] Date: 07/07/2020 Reason for consult: Follow-up assessment Age:24 hours   P2 mother whose infant is now 78 hours old.  This is a term baby at 39+1 weeks.  Baby was asleep in mother's arms when I arrived.  Mother had no questions/concerns related to breast feeding.  She feels like her son has been latching and feeding well.  Mother denies pain with feedings.  Engorgement prevention/treatment reviewed.  Mother has a manual pump and a DEBP for home use.  No support person present at this time.   Maternal Data    Feeding    LATCH Score Latch: Grasps breast easily, tongue down, lips flanged, rhythmical sucking.  Audible Swallowing: A few with stimulation  Type of Nipple: Everted at rest and after stimulation  Comfort (Breast/Nipple): Soft / non-tender  Hold (Positioning): No assistance needed to correctly position infant at breast.  LATCH Score: 9   Lactation Tools Discussed/Used    Interventions Interventions: Education  Discharge Discharge Education: Engorgement and breast care  Consult Status Consult Status: Complete Date: 07/07/20 Follow-up type: Call as needed    Michal Strzelecki R [name] 07/07/2020, 10:13 AM

## 2020-07-08 ENCOUNTER — Telehealth: Payer: Self-pay

## 2020-07-08 NOTE — Telephone Encounter (Signed)
Transition Care Management Unsuccessful Follow-up Telephone Call  Date of discharge and from where:  07/07/2020 from [facility]  Attempts:  1st Attempt  Reason for unsuccessful TCM follow-up call:  No answer/busy

## 2020-07-09 NOTE — Telephone Encounter (Signed)
Transition Care Management Follow-up Telephone Call  Date of discharge and from where: 07/07/2020 from [facility].   How have you been since you were released from the hospital? Pt states that she is feeling well and is not having any trouble.   Any questions or concerns? No  Items Reviewed:  Did the pt receive and understand the discharge instructions provided? Yes   Medications obtained and verified? No   Other? No   Any new allergies since your discharge? No   Dietary orders reviewed? N/A  Do you have support at home? Yes   Functional Questionnaire: (I = Independent and D = Dependent) ADLs: I  Bathing/Dressing- I  Meal Prep- I  Eating- I  Maintaining continence- I  Transferring/Ambulation- I  Managing Meds- I   Follow up appointments reviewed:   [facility] Hospital f/u appt confirmed? Yes  Scheduled to see Deno Lunger, MD on 07/11/2020 @ 11:10am.  Are transportation arrangements needed? No   If their condition worsens, is the pt aware to call PCP or go to the Emergency Dept.? Yes  Was the patient provided with contact information for the PCP's office or ED? Yes  Was to pt encouraged to call back with questions or concerns? Yes

## 2020-07-11 ENCOUNTER — Encounter: Payer: Self-pay | Admitting: Obstetrics and Gynecology

## 2020-07-11 ENCOUNTER — Ambulatory Visit (INDEPENDENT_AMBULATORY_CARE_PROVIDER_SITE_OTHER): Payer: Medicaid Other | Admitting: Obstetrics and Gynecology

## 2020-07-11 ENCOUNTER — Other Ambulatory Visit: Payer: Self-pay

## 2020-07-11 DIAGNOSIS — O24410 Gestational diabetes mellitus in pregnancy, diet controlled: Secondary | ICD-10-CM

## 2020-07-11 DIAGNOSIS — Z9851 Tubal ligation status: Secondary | ICD-10-CM

## 2020-07-11 NOTE — Progress Notes (Signed)
Joyce Ball presents for incision check S/P RLTCS and BTL Post op course unremarkable Breast feeding Denies any bowel or bladder dysfunction  PE AF VSS Lungs clear Heart  RRR Abd soft + BS, incision, honeycomb dressing removed, stri strips in place, healing well  A/P S/P C section with BTL, incision check Doing well. Wound care reviewed with pt. Increase activity as tolerates. F/U in 4 weeks for PP visit, fasting for Glucola d/t H/O GDM

## 2020-07-11 NOTE — Patient Instructions (Signed)
Cesarean Delivery, Care After This sheet gives you information about how to care for yourself after your procedure. Your health care provider may also give you more specific instructions. If you have problems or questions, contact your health care provider. What can I expect after the procedure? After the procedure, it is common to have:  A small amount of blood or clear fluid coming from the incision.  Some redness, swelling, and pain in your incision area.  Some abdominal pain and soreness.  Vaginal bleeding (lochia). Even though you did not have a vaginal delivery, you will still have vaginal bleeding and discharge.  Pelvic cramps.  Fatigue. You may have pain, swelling, and discomfort in the tissue between your vagina and your anus (perineum) if:  Your C-section was unplanned, and you were allowed to labor and push.  An incision was made in the area (episiotomy) or the tissue tore during attempted vaginal delivery. Follow these instructions at home: Incision care  Follow instructions from your health care provider about how to take care of your incision. Make sure you: ? Wash your hands with soap and water before you change your bandage (dressing). If soap and water are not available, use hand sanitizer. ? If you have a dressing, change it or remove it as told by your health care provider. ? Leave stitches (sutures), skin staples, skin glue, or adhesive strips in place. These skin closures may need to stay in place for 2 weeks or longer. If adhesive strip edges start to loosen and curl up, you may trim the loose edges. Do not remove adhesive strips completely unless your health care provider tells you to do that.  Check your incision area every day for signs of infection. Check for: ? More redness, swelling, or pain. ? More fluid or blood. ? Warmth. ? Pus or a bad smell.  Do not take baths, swim, or use a hot tub until your health care provider says it's okay. Ask your health  care provider if you can take showers.  When you cough or sneeze, hug a pillow. This helps with pain and decreases the chance of your incision opening up (dehiscing). Do this until your incision heals.   Medicines  Take over-the-counter and prescription medicines only as told by your health care provider.  If you were prescribed an antibiotic medicine, take it as told by your health care provider. Do not stop taking the antibiotic even if you start to feel better.  Do not drive or use heavy machinery while taking prescription pain medicine. Lifestyle  Do not drink alcohol. This is especially important if you are breastfeeding or taking pain medicine.  Do not use any products that contain nicotine or tobacco, such as cigarettes, e-cigarettes, and chewing tobacco. If you need help quitting, ask your health care provider. Eating and drinking  Drink at least 8 eight-ounce glasses of water every day unless told not to by your health care provider. If you breastfeed, you may need to drink even more water.  Eat high-fiber foods every day. These foods may help prevent or relieve constipation. High-fiber foods include: ? Whole grain cereals and breads. ? Brown rice. ? Beans. ? Fresh fruits and vegetables. Activity  If possible, have someone help you care for your baby and help with household activities for at least a few days after you leave the hospital.  Return to your normal activities as told by your health care provider. Ask your health care provider what activities are safe for   you.  Rest as much as possible. Try to rest or take a nap while your baby is sleeping.  Do not lift anything that is heavier than 10 lbs (4.5 kg), or the limit that you were told, until your health care provider says that it is safe.  Talk with your health care provider about when you can engage in sexual activity. This may depend on your: ? Risk of infection. ? How fast you heal. ? Comfort and desire to  engage in sexual activity.   General instructions  Do not use tampons or douches until your health care provider approves.  Wear loose, comfortable clothing and a supportive and well-fitting bra.  Keep your perineum clean and dry. Wipe from front to back when you use the toilet.  If you pass a blood clot, save it and call your health care provider to discuss. Do not flush blood clots down the toilet before you get instructions from your health care provider.  Keep all follow-up visits for you and your baby as told by your health care provider. This is important. Contact a health care provider if:  You have: ? A fever. ? Bad-smelling vaginal discharge. ? Pus or a bad smell coming from your incision. ? Difficulty or pain when urinating. ? A sudden increase or decrease in the frequency of your bowel movements. ? More redness, swelling, or pain around your incision. ? More fluid or blood coming from your incision. ? A rash. ? Nausea. ? Little or no interest in activities you used to enjoy. ? Questions about caring for yourself or your baby.  Your incision feels warm to the touch.  Your breasts turn red or become painful or hard.  You feel unusually sad or worried.  You vomit.  You pass a blood clot from your vagina.  You urinate more than usual.  You are dizzy or light-headed. Get help right away if:  You have: ? Pain that does not go away or get better with medicine. ? Chest pain. ? Difficulty breathing. ? Blurred vision or spots in your vision. ? Thoughts about hurting yourself or your baby. ? New pain in your abdomen or in one of your legs. ? A severe headache.  You faint.  You bleed from your vagina so much that you fill more than one sanitary pad in one hour. Bleeding should not be heavier than your heaviest period. Summary  After the procedure, it is common to have pain at your incision site, abdominal cramping, and slight bleeding from your vagina.  Check  your incision area every day for signs of infection.  Tell your health care provider about any unusual symptoms.  Keep all follow-up visits for you and your baby as told by your health care provider. This information is not intended to replace advice given to you by your health care provider. Make sure you discuss any questions you have with your health care provider. Document Revised: 11/09/2017 Document Reviewed: 11/09/2017 Elsevier Patient Education  2021 [employer]  

## 2020-08-08 ENCOUNTER — Other Ambulatory Visit: Payer: Medicaid Other

## 2020-08-08 ENCOUNTER — Ambulatory Visit: Payer: Medicaid Other | Admitting: Advanced Practice Midwife

## 2020-08-12 ENCOUNTER — Other Ambulatory Visit: Payer: Self-pay

## 2020-08-12 ENCOUNTER — Encounter: Payer: Self-pay | Admitting: Women's Health

## 2020-08-12 ENCOUNTER — Ambulatory Visit (INDEPENDENT_AMBULATORY_CARE_PROVIDER_SITE_OTHER): Payer: Medicaid Other | Admitting: Women's Health

## 2020-08-12 ENCOUNTER — Other Ambulatory Visit (HOSPITAL_COMMUNITY)
Admission: RE | Admit: 2020-08-12 | Discharge: 2020-08-12 | Disposition: A | Discharge status: Home / Self Care | Payer: Medicaid Other | Source: Ambulatory Visit | Attending: Advanced Practice Midwife | Admitting: Advanced Practice Midwife

## 2020-08-12 ENCOUNTER — Other Ambulatory Visit: Payer: Medicaid Other

## 2020-08-12 DIAGNOSIS — Z01419 Encounter for gynecological examination (general) (routine) without abnormal findings: Secondary | ICD-10-CM

## 2020-08-12 DIAGNOSIS — Z8632 Personal history of gestational diabetes: Secondary | ICD-10-CM | POA: Diagnosis not present

## 2020-08-12 DIAGNOSIS — Z98891 History of uterine scar from previous surgery: Secondary | ICD-10-CM | POA: Diagnosis not present

## 2020-08-12 DIAGNOSIS — Z131 Encounter for screening for diabetes mellitus: Secondary | ICD-10-CM

## 2020-08-12 NOTE — Progress Notes (Signed)
POSTPARTUM VISIT Patient name: Joyce Ball MRN [id number]  Date of birth: [birth year] Chief Complaint:   Postpartum Care and Gynecologic Exam  History of Present Illness:   Joyce Ball is a 25 y.o. G17P2002 Caucasian female being seen today for a postpartum visit. She is 5 weeks postpartum following a repeat cesarean section, low transverse incision at 39.1 gestational weeks. IOL: No, for n/a. Anesthesia: spinal.  Laceration: n/a.  Complications: none. Inpatient contraception: yes BTL.   Pregnancy complicated by P9KF. Tobacco use: former. Substance use disorder: no. Last pap smear: 2019 and results were negative per pt report at [facility]. Next pap smear due: now No LMP recorded.  Postpartum course has been uncomplicated. Bleeding no bleeding. Bowel function is normal. Bladder function is normal. Urinary incontinence? No, fecal incontinence? No Patient is not sexually active. Last sexual activity: prior to birth of baby.  Desired contraception: BTL done PP. Patient does not want a pregnancy in the future.  Desired family size is 2 children.   Upstream - 08/12/20 0912      Pregnancy Intention Screening   Does the patient want to become pregnant in the next year? No    Does the patient's partner want to become pregnant in the next year? No    Would the patient like to discuss contraceptive options today? No      Contraception Wrap Up   Current Method Female Sterilization    End Method Female Sterilization    Contraception Counseling Provided No          The pregnancy intention screening data noted above was reviewed. Potential methods of contraception were discussed. The patient elected to proceed with Female Sterilization.   Edinburgh Postpartum Depression Screening: Negative  Edinburgh Postnatal Depression Scale - 08/12/20 0913      Edinburgh Postnatal Depression Scale:  In the Past 7 Days   I have been able to laugh and see the funny side of things. 0    I have looked  forward with enjoyment to things. 0    I have blamed myself unnecessarily when things went wrong. 0    I have been anxious or worried for no good reason. 0    I have felt scared or panicky for no good reason. 0    Things have been getting on top of me. 0    I have been so unhappy that I have had difficulty sleeping. 0    I have felt sad or miserable. 0    I have been so unhappy that I have been crying. 0    The thought of harming myself has occurred to me. 0    Edinburgh Postnatal Depression Scale Total 0          Baby's course has been uncomplicated. Baby is feeding by breast: milk supply adequate. Infant has a pediatrician/family doctor? Yes.  Childcare strategy if returning to work/school: daycare.  Pt has material needs met for her and baby: Yes.   Review of Systems:   Pertinent items are noted in HPI Denies Abnormal vaginal discharge w/ itching/odor/irritation, headaches, visual changes, shortness of breath, chest pain, abdominal pain, severe nausea/vomiting, or problems with urination or bowel movements. Pertinent History Reviewed:  Reviewed past medical,surgical, obstetrical and family history.  Reviewed problem list, medications and allergies. OB History  Gravida Para Term Preterm AB Living  2 2 2     2   SAB IAB Ectopic Multiple Live Births        0 2    #  Outcome Date GA Lbr Len/2nd Weight Sex Delivery Anes PTL Lv  2 Term 07/05/20 [redacted]w[redacted]d 8 lb 5.3 oz (3.779 kg) M CS-LTranv Spinal  LIV  1 Term 10/04/15 [id number]:01 / 00:41 7 lb 8.6 oz (3.42 kg) F CS-LTranv EPI N LIV     Complications: Fetal Intolerance, Failed vacuum extraction delivery   Physical Assessment:   Vitals:   08/12/20 0914  BP: 123/72  Pulse: 76  Weight: 172 lb 3.2 oz (78.1 kg)  Height: 5' 4"  (1.626 m)  Body mass index is 29.56 kg/m.       Physical Examination: by SaJeanann LewandowskySNP  General appearance: alert, well appearing, and in no distress  Mental status: alert, oriented to person, place, and  time  Skin: warm & dry   Cardiovascular: normal heart rate noted   Respiratory: normal respiratory effort, no distress   Breasts: deferred, no complaints   Abdomen: soft, non-tender   Pelvic: VULVA: normal appearing vulva with no masses, tenderness or lesions, VAGINA: normal appearing vagina with normal color and discharge, no lesions, CERVIX: normal appearing cervix without discharge or lesions, thin prep pap obtained  Rectal: no hemorrhoids  Extremities: no edema  Chaperone: me         No results found for this or any previous visit (from the past 24 hour(s)).  Assessment & Plan:  1) Postpartum exam 2) 5 wks s/p repeat cesarean section, low transverse incision 3) breast feeding 4) Depression screening 5) Contraception> s/p BTL 6) GDM during pregnancy> GTT today  Essential components of care per ACOG recommendations:  1.  Mood and well being:  . If positive depression screen, discussed and plan developed.  . If using tobacco we discussed reduction/cessation and risk of relapse . If current substance abuse, we discussed and referral to local resources was offered.   2. Infant care and feeding:  . If breastfeeding, discussed returning to work, pumping, breastfeeding-associated pain, guidance regarding return to fertility while lactating if not using another method. If needed, patient was provided with a letter to be allowed to pump q 2-3hrs to support lactation in a private location with access to a refrigerator to store breastmilk.   . Recommended that all caregivers be immunized for flu, pertussis and other preventable communicable diseases . If pt does not have material needs met for her/baby, referred to local resources for help obtaining these.  3. Sexuality, contraception and birth spacing . Provided guidance regarding sexuality, management of dyspareunia, and resumption of intercourse . Discussed avoiding interpregnancy interval <34m40m and recommended birth spacing of 18  months  4. Sleep and fatigue . Discussed coping options for fatigue and sleep disruption . Encouraged family/partner/community support of 4 hrs of uninterrupted sleep to help with mood and fatigue  5. Physical recovery  . If pt had a C/S, assessed incisional pain and providing guidance on normal vs prolonged recovery . If pt had a laceration, perineal healing and pain reviewed.  . If urinary or fecal incontinence, discussed management and referred to PT or uro/gyn if indicated  . Patient is safe to resume physical activity. Discussed attainment of healthy weight.  6.  Chronic disease management . Discussed pregnancy complications if any, and their implications for future childbearing and long-term maternal health. . Review recommendations for prevention of recurrent pregnancy complications, such as 17 hydroxyprogesterone caproate to reduce risk for recurrent PTB not applicable, or aspirin to reduce risk of preeclampsia not applicable. . Pt had GDM: Yes. If yes, 2hr GTT scheduled:  doing today . Reviewed medications and non-pregnant dosing including consideration of whether pt is breastfeeding using a reliable resource such as LactMed: yes . Referred for f/u w/ PCP or subspecialist providers as indicated: not applicable  7. Health maintenance . Mammogram at 24yo or earlier if indicated . Pap smears as indicated  Meds: No orders of the defined types were placed in this encounter.   Follow-up: Return in about 1 year (around 08/12/2021) for Physical.   No orders of the defined types were placed in this encounter.   [place], [facility] 08/12/2020 9:54 AM

## 2020-08-13 LAB — CYTOLOGY - PAP: Diagnosis: NEGATIVE

## 2020-08-13 LAB — GLUCOSE TOLERANCE, 2 HOURS W/ 1HR
Glucose, 1 hour: 103 mg/dL (ref 65–179)
Glucose, 2 hour: 93 mg/dL (ref 65–152)
Glucose, Fasting: 72 mg/dL (ref 65–91)

## 2021-09-04 ENCOUNTER — Encounter (HOSPITAL_COMMUNITY): Payer: Self-pay

## 2021-09-04 ENCOUNTER — Other Ambulatory Visit: Payer: Self-pay

## 2021-09-04 ENCOUNTER — Emergency Department (HOSPITAL_COMMUNITY)
Admission: EM | Admit: 2021-09-04 | Discharge: 2021-09-04 | Disposition: A | Discharge status: Home / Self Care | Payer: Medicaid Other | Attending: Emergency Medicine | Admitting: Emergency Medicine

## 2021-09-04 DIAGNOSIS — H5712 Ocular pain, left eye: Secondary | ICD-10-CM | POA: Diagnosis present

## 2021-09-04 DIAGNOSIS — H1032 Unspecified acute conjunctivitis, left eye: Secondary | ICD-10-CM | POA: Insufficient documentation

## 2021-09-04 MED ORDER — TETRACAINE HCL 0.5 % OP SOLN
2.0000 [drp] | Freq: Once | OPHTHALMIC | Status: DC
  Filled 2021-09-04: qty 4

## 2021-09-04 MED ORDER — POLYMYXIN B-TRIMETHOPRIM 10000-0.1 UNIT/ML-% OP SOLN: 1.0000 [drp] | OPHTHALMIC | 0 refills | Status: AC

## 2021-09-04 MED ORDER — FLUORESCEIN SODIUM 1 MG OP STRP
1.0000 | ORAL_STRIP | Freq: Once | OPHTHALMIC | Status: AC
  Administered 2021-09-04: 1 via OPHTHALMIC
  Filled 2021-09-04: qty 1

## 2021-09-04 NOTE — ED Notes (Signed)
Visual acuity: ? ?Left: 20/30 ?Right: 20/30 ?Both: 20/30 ?

## 2021-09-04 NOTE — ED Provider Notes (Addendum)
?[facility] EMERGENCY DEPARTMENT ?Provider Note ? ? ?CSN: [id number] ?Arrival date & time: 09/04/21  0849 ? ?  ? ?History ? ?Chief Complaint  ?Patient presents with  ? Eye Problem  ? ? ?Joyce Ball is a 26 y.o. female with a significant past medical history presenting with left eye pain, redness, itching and purulent drainage which started yesterday.  She reports her right eye has had some mild erythema for several weeks but has been otherwise asymptomatic.  She denies any vision changes.  She woke this morning with crusting along her eyelid margins and had use warm compresses to clean her eye.  She does endorse that her children were both diagnosed with pinkeye, but this was 3 weeks ago and have been treated.  She used a dose of their leftover ophthalmic antibiotic yesterday evening but woke with increased swelling around the left eye.  She does not wear contacts, she does have a prescription for glasses for distance but does not wear as she does not feel she has any vision deficits.  She denies fevers or chills, has had no nasal drainage, congestion, sore throat, sneezing or other symptoms suggesting allergy. ? ?The history is provided by the patient.  ? ?  ? ?Home Medications ?Prior to Admission medications   ?Medication Sig Start Date End Date Taking? Authorizing Provider  ?trimethoprim-polymyxin b (POLYTRIM) ophthalmic solution Place 1 drop into both eyes every 4 (four) hours for 7 days. 09/04/21 09/11/21 Yes Adley Castello, Raynelle Fanning, PA-C  ?acetaminophen (TYLENOL) 500 MG tablet Take 2 tablets (1,000 mg total) by mouth every 6 (six) hours as needed for mild pain or moderate pain (temperature > 101.5.). 07/07/20   Alric Seton, MD  ?coconut oil OIL Apply 1 application topically as needed. 07/07/20   Alric Seton, MD  ?ferrous sulfate 325 (65 FE) MG tablet Take 1 tablet (325 mg total) by mouth every other day. ?Patient not taking: Reported on 08/12/2020 07/07/20 07/07/21  Alric Seton, MD  ?ibuprofen (ADVIL)  600 MG tablet Take 1 tablet (600 mg total) by mouth every 6 (six) hours as needed. 07/07/20   Alric Seton, MD  ?Prenatal MV-Min-FA-Omega-3 (PRENATAL GUMMIES/DHA & FA) 0.4-32.5 MG CHEW Chew 1 each by mouth daily.    [provider]  ?vitamin C (ASCORBIC ACID) 250 MG tablet Take 1 tablet (250 mg total) by mouth every other day. Take with iron 07/07/20   Alric Seton, MD  ?VITAMIN D PO Take by mouth.    [provider]  ?   ? ?Allergies    ?Sulfa antibiotics, Penicillins, and Sulfur   ? ?Review of Systems   ?Review of Systems  ?Constitutional:  Negative for chills and fever.  ?HENT:  Negative for congestion, rhinorrhea, sinus pain and sore throat.   ?Eyes:  Positive for pain, discharge, redness and itching. Negative for photophobia and visual disturbance.  ?Respiratory: Negative.    ?Cardiovascular: Negative.   ?Gastrointestinal:  Negative for nausea.  ?Genitourinary: Negative.   ?Musculoskeletal:  Negative for arthralgias and neck pain.  ?Skin: Negative.  Negative for rash.  ?Neurological:  Negative for dizziness, weakness, light-headedness, numbness and headaches.  ?Psychiatric/Behavioral: Negative.    ?All other systems reviewed and are negative. ? ?Physical Exam ?Updated Vital Signs ?BP 104/70   Pulse 74   Temp 98.4 ?F (36.9 ?C) (Oral)   Resp 17   Ht 5\' 4"  (1.626 m)   Wt 79.4 kg   SpO2 98%   Breastfeeding Yes   BMI 30.04  kg/m?  ?Physical Exam ?Vitals and nursing note reviewed.  ?Constitutional:   ?   Appearance: She is well-developed.  ?HENT:  ?   Head: Normocephalic and atraumatic.  ?Eyes:  ?   General:     ?   Right eye: No foreign body.     ?   Left eye: Discharge present.No foreign body.  ?   Extraocular Movements: Extraocular movements intact.  ?   Conjunctiva/sclera:  ?   Right eye: Right conjunctiva is injected.  ?   Left eye: Left conjunctiva is injected.  ?   Pupils: Pupils are equal, round, and reactive to light.  ?   Left eye: No corneal abrasion or fluorescein  uptake.  ?   Slit lamp exam: ?   Right eye: No photophobia.  ?   Left eye: No corneal ulcer or photophobia.  ?   Comments: Chemosis of left periorbital area, no significant erythema except for conjunctiva.  ? ? ?Visual acuity: ?  ?Left: 20/30 ?Right: 20/30 ?Both: 20/30 ?  ?Cardiovascular:  ?   Rate and Rhythm: Normal rate and regular rhythm.  ?   Heart sounds: Normal heart sounds.  ?Pulmonary:  ?   Effort: Pulmonary effort is normal.  ?   Breath sounds: Normal breath sounds. No wheezing.  ?Abdominal:  ?   General: Bowel sounds are normal.  ?   Palpations: Abdomen is soft.  ?   Tenderness: There is no abdominal tenderness.  ?Musculoskeletal:     ?   General: Normal range of motion.  ?   Cervical back: Normal range of motion.  ?Skin: ?   General: Skin is warm and dry.  ?Neurological:  ?   Mental Status: She is alert.  ? ? ?ED Results / Procedures / Treatments   ?Labs ?(all labs ordered are listed, but only abnormal results are displayed) ?Labs Reviewed - No data to display ? ?EKG ?None ? ?Radiology ?No results found. ? ?Procedures ?Procedures  ? ? ?Medications Ordered in ED ?Medications  ?tetracaine (PONTOCAINE) 0.5 % ophthalmic solution 2 drop (2 drops Left Eye Not Given 09/04/21 0924)  ?fluorescein ophthalmic strip 1 strip (1 strip Left Eye Given 09/04/21 0924)  ? ? ?ED Course/ Medical Decision Making/ A&P ?  ?                        ?Medical Decision Making ?Patient with exam and history suggesting a conjunctivitis, probably viral.  She will be treated with topical antibiotics.  We discussed other home remedies and ways to avoid spreading this infection. ? ?She has no foreign body or corneal abrasion on her exam, she has no appreciable eye pain and her visual acuity is normal for her baseline.  Given his lack of findings, doubt this represents glaucoma, iritis, no eye trauma per history. ? ?Risk ?Prescription drug management. ? ? ? ? ? ? ? ? ? ? ?Final Clinical Impression(s) / ED Diagnoses ?Final diagnoses:  ?Acute  conjunctivitis of left eye, unspecified acute conjunctivitis type  ? ? ?Rx / DC Orders ?ED Discharge Orders   ? ?      Ordered  ?  trimethoprim-polymyxin b (POLYTRIM) ophthalmic solution  Every 4 hours       ? 09/04/21 0940  ? ?  ?  ? ?  ? ? ?  ?Burgess Amor, PA-C ?09/04/21 1029 ? ?  ?Burgess Amor, PA-C ?09/04/21 1030 ? ?  ?Eber Hong, MD ?09/05/21 [phone] ? ?

## 2021-09-04 NOTE — ED Triage Notes (Signed)
Swelling, redness, pain to left since yesterday. Reports no change in vision. Noted right eye slightly reddened.  ?

## 2021-09-04 NOTE — Discharge Instructions (Signed)
Use the eyedrops prescribed every 4 hours while awake for the next 7 days.  We also discussed frequent handwashing, especially if you catch herself touching your face or eyes as this is contagious.  Cool compresses will help with swelling and avoiding rubbing your eye will also help prevent further swelling. ?

## 2022-01-24 ENCOUNTER — Emergency Department (HOSPITAL_COMMUNITY)
Admission: EM | Admit: 2022-01-24 | Discharge: 2022-01-24 | Disposition: A | Discharge status: Home / Self Care | Payer: Medicaid Other | Attending: Emergency Medicine | Admitting: Emergency Medicine

## 2022-01-24 ENCOUNTER — Emergency Department (HOSPITAL_COMMUNITY): Payer: Medicaid Other

## 2022-01-24 ENCOUNTER — Other Ambulatory Visit: Payer: Self-pay

## 2022-01-24 ENCOUNTER — Encounter (HOSPITAL_COMMUNITY): Payer: Self-pay | Admitting: Emergency Medicine

## 2022-01-24 DIAGNOSIS — M278 Other specified diseases of jaws: Secondary | ICD-10-CM

## 2022-01-24 DIAGNOSIS — R6884 Jaw pain: Secondary | ICD-10-CM | POA: Diagnosis not present

## 2022-01-24 DIAGNOSIS — M2740 Unspecified cyst of jaw: Secondary | ICD-10-CM | POA: Insufficient documentation

## 2022-01-24 DIAGNOSIS — R519 Headache, unspecified: Secondary | ICD-10-CM | POA: Diagnosis present

## 2022-01-24 NOTE — ED Notes (Signed)
Patient transported to CT 

## 2022-01-24 NOTE — ED Provider Notes (Signed)
[facility] EMERGENCY DEPARTMENT Provider Note   CSN: [id number] Arrival date & time: 01/24/22  2015     History  Chief Complaint  Patient presents with   Mouth pain    Joyce Ball is a 26 y.o. female with noncontributory past medical history presents with concern for lump on the left jaw that she noticed today.  Patient reports that she has had a history of cavities, but no severe cavities or broken teeth.  She reports that the lump feels irritated when she presses on it but she can control the pain otherwise.  She reports that she does not take anything for pain because she does not like to take pain medication.  She denies any foul taste in her mouth, fever, chills, nausea, vomiting.  HPI     Home Medications Prior to Admission medications   Medication Sig Start Date End Date Taking? Authorizing Provider  acetaminophen (TYLENOL) 500 MG tablet Take 2 tablets (1,000 mg total) by mouth every 6 (six) hours as needed for mild pain or moderate pain (temperature > 101.5.). 07/07/20   Alric Seton, MD  coconut oil OIL Apply 1 application topically as needed. 07/07/20   Alric Seton, MD  ferrous sulfate 325 (65 FE) MG tablet Take 1 tablet (325 mg total) by mouth every other day. Patient not taking: Reported on 08/12/2020 07/07/20 07/07/21  Alric Seton, MD  ibuprofen (ADVIL) 600 MG tablet Take 1 tablet (600 mg total) by mouth every 6 (six) hours as needed. 07/07/20   Alric Seton, MD  Prenatal MV-Min-FA-Omega-3 (PRENATAL GUMMIES/DHA & FA) 0.4-32.5 MG CHEW Chew 1 each by mouth daily.    [provider]  vitamin C (ASCORBIC ACID) 250 MG tablet Take 1 tablet (250 mg total) by mouth every other day. Take with iron 07/07/20   Alric Seton, MD  VITAMIN D PO Take by mouth.    [provider]      Allergies    Sulfa antibiotics, Penicillins, and Sulfur    Review of Systems   Review of Systems  HENT:  Positive for dental problem.   All other  systems reviewed and are negative.   Physical Exam Updated Vital Signs BP 127/88   Pulse 85   Temp 98.3 F (36.8 C) (Oral)   Resp 18   LMP 01/08/2022 (Exact Date)   SpO2 100%  Physical Exam Vitals and nursing note reviewed.  Constitutional:      General: She is not in acute distress.    Appearance: Normal appearance.  HENT:     Head: Normocephalic and atraumatic.     Comments: No significant dental abnormality noted, patient does have a palpable firm, mobile nodule that is approximately 2 mm in size on the left buccal mucosa overlying the mandible.  No significant soft tissue swelling, signs of fluctuance, or redness of the surrounding tissue. Eyes:     General:        Right eye: No discharge.        Left eye: No discharge.  Cardiovascular:     Rate and Rhythm: Normal rate and regular rhythm.  Pulmonary:     Effort: Pulmonary effort is normal. No respiratory distress.  Musculoskeletal:        General: No deformity.  Skin:    General: Skin is warm and dry.  Neurological:     Mental Status: She is alert and oriented to person, place, and time.  Psychiatric:  Mood and Affect: Mood normal.        Behavior: Behavior normal.     ED Results / Procedures / Treatments   Labs (all labs ordered are listed, but only abnormal results are displayed) Labs Reviewed - No data to display  EKG None  Radiology CT Maxillofacial Wo Contrast  Result Date: 01/24/2022 CLINICAL DATA:  Maxillary/facial abscess. Pain to left cheek and jaw. EXAM: CT MAXILLOFACIAL WITHOUT CONTRAST TECHNIQUE: Multidetector CT imaging of the maxillofacial structures was performed. Multiplanar CT image reconstructions were also generated. RADIATION DOSE REDUCTION: This exam was performed according to the departmental dose-optimization program which includes automated exposure control, adjustment of the mA and/or kV according to patient size and/or use of iterative reconstruction technique. COMPARISON:  None  Available. FINDINGS: Osseous: No fracture or mandibular dislocation. No destructive process. Orbits: Negative. No traumatic or inflammatory finding. Sinuses: Clear Soft tissues: Negative. No visible soft tissue abscess or other abnormality. Limited intracranial: No significant or unexpected finding. IMPRESSION: No visible facial soft tissue abscess. Osseous structures unremarkable. Electronically Signed   By: Charlett Nose M.D.   On: 01/24/2022 23:04    Procedures Procedures    Medications Ordered in ED Medications - No data to display  ED Course/ Medical Decision Making/ A&P                           Medical Decision Making Amount and/or Complexity of Data Reviewed Radiology: ordered.   This is a well-appearing 26 year old female who presents with concern for new hard lump on left lower jaw.  On my exam patient has a firm, mobile, tender around 2 to 3 mm nodule in the left buccal mucosa overlying the left mandible.  No signs of significant dental dysfunction.  No significant soft tissue swelling, cellulitis.  My emergent differential diagnosis includes lymphadenopathy, small isolated bone island, dental abscess, versus other soft tissue mass, cyst versus other.  I independently interpreted imaging including ct maxillofacial without contrast which shows no evidence of soft tissue abnormality, bony island, or other explanation for patient's symptoms. I agree with the radiologist interpretation. Considered antibiotic treatment for possible dental abscess or infection, however patient's not consistent with this at this time.  Suspicious of either small nonbloody cyst, or small reactive lymph node.  Final Clinical Impression(s) / ED Diagnoses Final diagnoses:  Face pain  Mass of jaw    Rx / DC Orders ED Discharge Orders     None         [place] 01/24/22 2312    Bethann Berkshire, MD 01/25/22 1044

## 2022-01-24 NOTE — ED Notes (Signed)
Patient verbalizes understanding of discharge instructions. Opportunity for questioning and answers were provided. Armband removed by staff, pt discharged from ED. Ambulated out to lobby  

## 2022-01-24 NOTE — Discharge Instructions (Signed)
Please use Tylenol or ibuprofen for pain.  You may use 600 mg ibuprofen every 6 hours or 1000 mg of Tylenol every 6 hours.  You may choose to alternate between the 2.  This would be most effective.  Not to exceed 4 g of Tylenol within 24 hours.  Not to exceed 3200 mg ibuprofen 24 hours.  Recommend PCP or dental follow-up for further evaluation, I do not recommend any antibiotics or other treatment at this time, if this does represent a small cyst or reactive lymph node it may resolve on its own without any treatment.  I did not see any evidence of neoplasm (cancer) or dental abscess at this time.  Please return if you begin develop fever, chills, the mass grows or worsens or you develop severe dental pain.

## 2022-01-24 NOTE — ED Triage Notes (Signed)
Pt in with pain to L jaw/cheek. States this morning, she noticed a hard lump to L lower jaw. Denies any fevers or drainage to area.

## 2022-02-15 ENCOUNTER — Emergency Department (HOSPITAL_COMMUNITY)
Admission: EM | Admit: 2022-02-15 | Discharge: 2022-02-15 | Disposition: A | Discharge status: Home / Self Care | Payer: Medicaid Other | Attending: Emergency Medicine | Admitting: Emergency Medicine

## 2022-02-15 ENCOUNTER — Other Ambulatory Visit: Payer: Self-pay

## 2022-02-15 ENCOUNTER — Encounter (HOSPITAL_COMMUNITY): Payer: Self-pay

## 2022-02-15 DIAGNOSIS — R1030 Lower abdominal pain, unspecified: Secondary | ICD-10-CM | POA: Diagnosis present

## 2022-02-15 DIAGNOSIS — Z5321 Procedure and treatment not carried out due to patient leaving prior to being seen by health care provider: Secondary | ICD-10-CM | POA: Diagnosis not present

## 2022-02-15 LAB — COMPREHENSIVE METABOLIC PANEL
ALT: 16 U/L (ref 0–44)
AST: 20 U/L (ref 15–41)
Albumin: 4.3 g/dL (ref 3.5–5.0)
Alkaline Phosphatase: 95 U/L (ref 38–126)
Anion gap: 8 (ref 5–15)
BUN: 11 mg/dL (ref 6–20)
CO2: 24 mmol/L (ref 22–32)
Calcium: 8.8 mg/dL — ABNORMAL LOW (ref 8.9–10.3)
Chloride: 104 mmol/L (ref 98–111)
Creatinine, Ser: 0.71 mg/dL (ref 0.44–1.00)
GFR, Estimated: >60 mL/min (ref >60)
Glucose, Bld: 87 mg/dL (ref 70–99)
Potassium: 3.5 mmol/L (ref 3.5–5.1)
Sodium: 136 mmol/L (ref 135–145)
Total Bilirubin: 0.7 mg/dL (ref 0.3–1.2)
Total Protein: 7.9 g/dL (ref 6.5–8.1)

## 2022-02-15 LAB — CBC
HCT: 45.4 % (ref 36.0–46.0)
Hemoglobin: 15.5 g/dL — ABNORMAL HIGH (ref 12.0–15.0)
MCH: 31.3 pg (ref 26.0–34.0)
MCHC: 34.1 g/dL (ref 30.0–36.0)
MCV: 91.5 fL (ref 80.0–100.0)
Platelets: 229 10*3/uL (ref 150–400)
RBC: 4.96 MIL/uL (ref 3.87–5.11)
RDW: 12.8 % (ref 11.5–15.5)
WBC: 10.2 10*3/uL (ref 4.0–10.5)
nRBC: 0 % (ref 0.0–0.2)

## 2022-02-15 LAB — URINALYSIS, ROUTINE W REFLEX MICROSCOPIC
Bilirubin Urine: NEGATIVE
Glucose, UA: NEGATIVE mg/dL
Hgb urine dipstick: NEGATIVE
Ketones, ur: NEGATIVE mg/dL
Leukocytes,Ua: NEGATIVE
Nitrite: NEGATIVE
Protein, ur: NEGATIVE mg/dL
Specific Gravity, Urine: 1.012 (ref 1.005–1.030)
pH: 6 (ref 5.0–8.0)

## 2022-02-15 LAB — LIPASE, BLOOD: Lipase: 27 U/L (ref 11–51)

## 2022-02-15 NOTE — ED Triage Notes (Signed)
Pt to er, pt states that she is here for some lower abd pain, states that her pain started this am, states that the pain is better when she is sitting down and worse when she stands.  States normal bm this am. Hx of tubal ligation.

## 2022-05-09 ENCOUNTER — Emergency Department (HOSPITAL_COMMUNITY)
Admission: EM | Admit: 2022-05-09 | Discharge: 2022-05-09 | Discharge status: Against Medical Advice | Payer: Medicaid Other | Source: Home / Self Care
